# Patient Record
Sex: Female | Born: 1970 | Race: Black or African American | Hispanic: No | Marital: Single | Smoking: Never smoker
Health system: Southern US, Community
[De-identification: ages and names within clinical notes are randomized; demographics above are authoritative.]

## PROBLEM LIST (undated history)

## (undated) ENCOUNTER — Emergency Department (HOSPITAL_BASED_OUTPATIENT_CLINIC_OR_DEPARTMENT_OTHER): Admission: EM | Payer: Medicaid Other

## (undated) DIAGNOSIS — R42 Dizziness and giddiness: Secondary | ICD-10-CM

## (undated) HISTORY — PX: OTHER SURGICAL HISTORY: SHX169

---

## 1997-09-25 ENCOUNTER — Other Ambulatory Visit: Admission: RE | Admit: 1997-09-25 | Discharge: 1997-09-25 | Payer: Self-pay | Admitting: Obstetrics and Gynecology

## 1997-10-23 ENCOUNTER — Encounter: Admission: RE | Admit: 1997-10-23 | Discharge: 1997-10-23 | Payer: Self-pay | Admitting: Family Medicine

## 1998-02-23 ENCOUNTER — Encounter: Payer: Self-pay | Admitting: Obstetrics and Gynecology

## 1998-02-23 ENCOUNTER — Ambulatory Visit (HOSPITAL_COMMUNITY): Admission: RE | Admit: 1998-02-23 | Discharge: 1998-02-23 | Payer: Self-pay | Admitting: Obstetrics and Gynecology

## 1998-03-24 ENCOUNTER — Inpatient Hospital Stay (HOSPITAL_COMMUNITY): Admission: AD | Admit: 1998-03-24 | Discharge: 1998-03-24 | Payer: Self-pay | Admitting: Obstetrics and Gynecology

## 1998-06-12 ENCOUNTER — Inpatient Hospital Stay (HOSPITAL_COMMUNITY): Admission: AD | Admit: 1998-06-12 | Discharge: 1998-06-12 | Payer: Self-pay | Admitting: Obstetrics and Gynecology

## 1998-06-12 ENCOUNTER — Inpatient Hospital Stay (HOSPITAL_COMMUNITY): Admission: AD | Admit: 1998-06-12 | Discharge: 1998-06-16 | Payer: Self-pay | Admitting: Obstetrics and Gynecology

## 1998-07-03 ENCOUNTER — Encounter: Admission: RE | Admit: 1998-07-03 | Discharge: 1998-07-03 | Payer: Self-pay | Admitting: Family Medicine

## 1998-07-10 ENCOUNTER — Encounter: Admission: RE | Admit: 1998-07-10 | Discharge: 1998-07-10 | Payer: Self-pay | Admitting: Family Medicine

## 1998-07-13 ENCOUNTER — Encounter: Admission: RE | Admit: 1998-07-13 | Discharge: 1998-07-13 | Payer: Self-pay | Admitting: Family Medicine

## 1999-03-10 ENCOUNTER — Other Ambulatory Visit: Admission: RE | Admit: 1999-03-10 | Discharge: 1999-03-10 | Payer: Self-pay | Admitting: Obstetrics and Gynecology

## 1999-12-12 ENCOUNTER — Inpatient Hospital Stay (HOSPITAL_COMMUNITY): Admission: AD | Admit: 1999-12-12 | Discharge: 1999-12-13 | Payer: Self-pay | Admitting: Obstetrics and Gynecology

## 2000-02-14 ENCOUNTER — Other Ambulatory Visit: Admission: RE | Admit: 2000-02-14 | Discharge: 2000-02-14 | Payer: Self-pay | Admitting: Obstetrics and Gynecology

## 2001-03-06 ENCOUNTER — Other Ambulatory Visit: Admission: RE | Admit: 2001-03-06 | Discharge: 2001-03-06 | Payer: Self-pay | Admitting: Internal Medicine

## 2001-05-09 ENCOUNTER — Encounter: Admission: RE | Admit: 2001-05-09 | Discharge: 2001-05-09 | Payer: Self-pay | Admitting: Family Medicine

## 2001-05-09 ENCOUNTER — Encounter: Payer: Self-pay | Admitting: Family Medicine

## 2001-12-09 ENCOUNTER — Inpatient Hospital Stay (HOSPITAL_COMMUNITY): Admission: AD | Admit: 2001-12-09 | Discharge: 2001-12-09 | Payer: Self-pay | Admitting: *Deleted

## 2002-01-25 ENCOUNTER — Other Ambulatory Visit: Admission: RE | Admit: 2002-01-25 | Discharge: 2002-01-25 | Payer: Self-pay | Admitting: Obstetrics and Gynecology

## 2002-03-19 ENCOUNTER — Ambulatory Visit (HOSPITAL_COMMUNITY): Admission: RE | Admit: 2002-03-19 | Discharge: 2002-03-19 | Payer: Self-pay | Admitting: Obstetrics and Gynecology

## 2002-03-19 ENCOUNTER — Encounter: Payer: Self-pay | Admitting: Obstetrics and Gynecology

## 2002-05-28 ENCOUNTER — Inpatient Hospital Stay (HOSPITAL_COMMUNITY): Admission: AD | Admit: 2002-05-28 | Discharge: 2002-05-28 | Payer: Self-pay | Admitting: Internal Medicine

## 2002-06-11 ENCOUNTER — Encounter: Admission: RE | Admit: 2002-06-11 | Discharge: 2002-06-11 | Payer: Self-pay | Admitting: Obstetrics and Gynecology

## 2002-06-14 ENCOUNTER — Inpatient Hospital Stay (HOSPITAL_COMMUNITY): Admission: AD | Admit: 2002-06-14 | Discharge: 2002-06-14 | Payer: Self-pay | Admitting: Obstetrics and Gynecology

## 2002-06-28 ENCOUNTER — Encounter: Payer: Self-pay | Admitting: Obstetrics and Gynecology

## 2002-06-28 ENCOUNTER — Inpatient Hospital Stay (HOSPITAL_COMMUNITY): Admission: AD | Admit: 2002-06-28 | Discharge: 2002-07-02 | Payer: Self-pay | Admitting: Obstetrics and Gynecology

## 2002-06-28 ENCOUNTER — Encounter (INDEPENDENT_AMBULATORY_CARE_PROVIDER_SITE_OTHER): Payer: Self-pay | Admitting: Specialist

## 2002-07-02 ENCOUNTER — Inpatient Hospital Stay (HOSPITAL_COMMUNITY): Admission: AD | Admit: 2002-07-02 | Discharge: 2002-07-02 | Payer: Self-pay | Admitting: Obstetrics and Gynecology

## 2003-11-20 ENCOUNTER — Other Ambulatory Visit: Admission: RE | Admit: 2003-11-20 | Discharge: 2003-11-20 | Payer: Self-pay | Admitting: Obstetrics and Gynecology

## 2004-01-07 ENCOUNTER — Emergency Department (HOSPITAL_COMMUNITY): Admission: EM | Admit: 2004-01-07 | Discharge: 2004-01-07 | Payer: Self-pay

## 2004-07-20 ENCOUNTER — Emergency Department (HOSPITAL_COMMUNITY): Admission: EM | Admit: 2004-07-20 | Discharge: 2004-07-20 | Payer: Self-pay | Admitting: Emergency Medicine

## 2004-07-27 ENCOUNTER — Ambulatory Visit (HOSPITAL_COMMUNITY): Admission: RE | Admit: 2004-07-27 | Discharge: 2004-07-27 | Payer: Self-pay | Admitting: Obstetrics and Gynecology

## 2004-09-22 ENCOUNTER — Inpatient Hospital Stay (HOSPITAL_COMMUNITY): Admission: AD | Admit: 2004-09-22 | Discharge: 2004-09-23 | Payer: Self-pay | Admitting: Obstetrics and Gynecology

## 2004-09-23 ENCOUNTER — Inpatient Hospital Stay (HOSPITAL_COMMUNITY): Admission: AD | Admit: 2004-09-23 | Discharge: 2004-09-23 | Payer: Self-pay | Admitting: Obstetrics and Gynecology

## 2004-10-25 ENCOUNTER — Inpatient Hospital Stay (HOSPITAL_COMMUNITY): Admission: AD | Admit: 2004-10-25 | Discharge: 2004-10-25 | Payer: Self-pay | Admitting: Obstetrics and Gynecology

## 2004-11-02 ENCOUNTER — Encounter: Admission: RE | Admit: 2004-11-02 | Discharge: 2004-11-02 | Payer: Self-pay | Admitting: Obstetrics and Gynecology

## 2004-11-08 ENCOUNTER — Inpatient Hospital Stay (HOSPITAL_COMMUNITY): Admission: AD | Admit: 2004-11-08 | Discharge: 2004-11-08 | Payer: Self-pay | Admitting: Obstetrics and Gynecology

## 2004-11-22 ENCOUNTER — Inpatient Hospital Stay (HOSPITAL_COMMUNITY): Admission: AD | Admit: 2004-11-22 | Discharge: 2004-11-22 | Payer: Self-pay | Admitting: Obstetrics and Gynecology

## 2004-12-06 ENCOUNTER — Inpatient Hospital Stay (HOSPITAL_COMMUNITY): Admission: AD | Admit: 2004-12-06 | Discharge: 2004-12-06 | Payer: Self-pay | Admitting: Obstetrics and Gynecology

## 2004-12-07 ENCOUNTER — Inpatient Hospital Stay (HOSPITAL_COMMUNITY): Admission: AD | Admit: 2004-12-07 | Discharge: 2004-12-08 | Payer: Self-pay | Admitting: Obstetrics and Gynecology

## 2004-12-13 ENCOUNTER — Inpatient Hospital Stay (HOSPITAL_COMMUNITY): Admission: AD | Admit: 2004-12-13 | Discharge: 2004-12-13 | Payer: Self-pay | Admitting: Obstetrics and Gynecology

## 2004-12-18 ENCOUNTER — Inpatient Hospital Stay (HOSPITAL_COMMUNITY): Admission: AD | Admit: 2004-12-18 | Discharge: 2004-12-18 | Payer: Self-pay | Admitting: Obstetrics and Gynecology

## 2004-12-31 ENCOUNTER — Inpatient Hospital Stay (HOSPITAL_COMMUNITY): Admission: RE | Admit: 2004-12-31 | Discharge: 2005-01-03 | Payer: Self-pay | Admitting: Obstetrics and Gynecology

## 2004-12-31 ENCOUNTER — Encounter (INDEPENDENT_AMBULATORY_CARE_PROVIDER_SITE_OTHER): Payer: Self-pay | Admitting: Specialist

## 2005-10-04 ENCOUNTER — Emergency Department (HOSPITAL_COMMUNITY): Admission: EM | Admit: 2005-10-04 | Discharge: 2005-10-04 | Payer: Self-pay | Admitting: Emergency Medicine

## 2006-04-26 ENCOUNTER — Emergency Department (HOSPITAL_COMMUNITY): Admission: EM | Admit: 2006-04-26 | Discharge: 2006-04-27 | Payer: Self-pay | Admitting: Emergency Medicine

## 2006-08-26 ENCOUNTER — Emergency Department (HOSPITAL_COMMUNITY): Admission: EM | Admit: 2006-08-26 | Discharge: 2006-08-26 | Payer: Self-pay | Admitting: Emergency Medicine

## 2008-05-26 ENCOUNTER — Emergency Department (HOSPITAL_COMMUNITY): Admission: EM | Admit: 2008-05-26 | Discharge: 2008-05-26 | Payer: Self-pay | Admitting: Emergency Medicine

## 2008-12-06 ENCOUNTER — Emergency Department (HOSPITAL_COMMUNITY): Admission: EM | Admit: 2008-12-06 | Discharge: 2008-12-06 | Payer: Self-pay | Admitting: Emergency Medicine

## 2009-12-05 ENCOUNTER — Emergency Department (HOSPITAL_COMMUNITY): Admission: EM | Admit: 2009-12-05 | Discharge: 2009-12-05 | Payer: Self-pay | Admitting: Family Medicine

## 2010-01-11 ENCOUNTER — Emergency Department (HOSPITAL_COMMUNITY)
Admission: EM | Admit: 2010-01-11 | Discharge: 2010-01-11 | Payer: Self-pay | Source: Home / Self Care | Admitting: Emergency Medicine

## 2010-03-22 LAB — URINALYSIS, ROUTINE W REFLEX MICROSCOPIC
Bilirubin Urine: NEGATIVE
Glucose, UA: NEGATIVE mg/dL
Ketones, ur: NEGATIVE mg/dL
Protein, ur: NEGATIVE mg/dL
pH: 8 (ref 5.0–8.0)

## 2010-03-22 LAB — POCT I-STAT, CHEM 8
BUN: 9 mg/dL (ref 6–23)
Chloride: 104 mEq/L (ref 96–112)
Creatinine, Ser: 0.9 mg/dL (ref 0.4–1.2)
Potassium: 4 mEq/L (ref 3.5–5.1)
Sodium: 139 mEq/L (ref 135–145)
TCO2: 27 mmol/L (ref 0–100)

## 2010-03-22 LAB — URINE MICROSCOPIC-ADD ON

## 2010-03-22 LAB — POCT PREGNANCY, URINE: Preg Test, Ur: NEGATIVE

## 2010-04-14 LAB — DIFFERENTIAL
Eosinophils Absolute: 0 10*3/uL (ref 0.0–0.7)
Lymphs Abs: 1.6 10*3/uL (ref 0.7–4.0)
Monocytes Absolute: 0.5 10*3/uL (ref 0.1–1.0)
Monocytes Relative: 12 % (ref 3–12)
Neutro Abs: 2.2 10*3/uL (ref 1.7–7.7)
Neutrophils Relative %: 50 % (ref 43–77)

## 2010-04-14 LAB — BASIC METABOLIC PANEL
CO2: 29 mEq/L (ref 19–32)
Chloride: 102 mEq/L (ref 96–112)
Creatinine, Ser: 1 mg/dL (ref 0.4–1.2)
GFR calc Af Amer: >60 mL/min (ref >60)
Sodium: 137 mEq/L (ref 135–145)

## 2010-04-14 LAB — CBC
Hemoglobin: 13.3 g/dL (ref 12.0–15.0)
MCHC: 33.9 g/dL (ref 30.0–36.0)
MCV: 99 fL (ref 78.0–100.0)
RBC: 3.94 MIL/uL (ref 3.87–5.11)
WBC: 4.3 10*3/uL (ref 4.0–10.5)

## 2010-04-20 LAB — POCT CARDIAC MARKERS
CKMB, poc: <1 ng/mL — ABNORMAL LOW (ref 1.0–8.0)
Myoglobin, poc: 88.5 ng/mL (ref 12–200)
Myoglobin, poc: 99.7 ng/mL (ref 12–200)

## 2010-04-20 LAB — D-DIMER, QUANTITATIVE: D-Dimer, Quant: 0.42 ug/mL-FEU (ref 0.00–0.48)

## 2010-05-28 NOTE — Discharge Summary (Signed)
[name], [name]               ACCOUNT NO.:  [id number]   MEDICAL RECORD NO.:  [id number]          PATIENT TYPE:  INP   LOCATION:  9112                          FACILITY:  [facility]   PHYSICIAN:  Malachi Pro. Ambrose Mantle, M.D. DATE OF BIRTH:  [birth year]   DATE OF ADMISSION:  12/31/2004  DATE OF DISCHARGE:  01/03/2005                                 DISCHARGE SUMMARY   This is a 40 year old black female, para 2-2-7-4, gravida 12, admitted for a  repeat C-section.  Blood group and type A positive, negative antibody, non-  reactive serology, rubella positive, RPR non-reactive, hemoglobin A, HIV  negative, GC and chlamydia negative, hepatitis B surface antigen negative.  The patient's prenatal course is outlined in the history and physical.  She  was admitted for a repeat C-section and tubal ligation after two prior C-  sections.  She underwent a low transverse cervical C-section and bilateral  tubal ligation by Dr. Ambrose Mantle with Dr. Senaida Ores assisting under general  anesthesia on December 31, 2004.  The female infant, 7 pounds 3 ounces, had  Apgars of 9 at one and 9 at five minutes.  Postoperatively, the patient did  very well.  She had no problems.  On the third postop day, she was voiding  well, passing flatus, tolerating a diet, ambulating well without difficulty,  and was ready for discharge.  Staples removed, strips were applied.   FINAL DIAGNOSES:  1.  Intrauterine pregnancy 39 weeks.  2.  Gestational diabetes.  3.  Voluntary sterilization.  4.  Prior Cesarean section x2.   OPERATION:  Low transverse cervical C-section, bilateral tubal ligation.   FINAL CONDITION:  Improved.   INSTRUCTIONS:  Include our regular discharge instruction booklet.  Patient  is advised to call with any problems.  Percocet 5/325 24 tablets one every  four to six hours as needed for pain is given at discharge.      Malachi Pro. Ambrose Mantle, M.D.  Electronically Signed     TFH/MEDQ  D:  01/03/2005  T:   01/03/2005  Job:  [id number]

## 2010-05-28 NOTE — Discharge Summary (Signed)
NAMEILITHYIA, TITZER                           ACCOUNT NO.:  [id number]   MEDICAL RECORD NO.:  [id number]                   PATIENT TYPE:  INP   LOCATION:  9128                                 FACILITY:  [facility]   PHYSICIAN:  Zenaida Niece, M.D.             DATE OF BIRTH:  Nov 18, 1970   DATE OF ADMISSION:  06/28/2002  DATE OF DISCHARGE:  07/02/2002                                 DISCHARGE SUMMARY   ADMISSION DIAGNOSES:  1. Intrauterine pregnancy at 32 weeks.  2. Preterm labor.  3. Abdominal pain.   DISCHARGE DIAGNOSES:  1. Intrauterine pregnancy at 32 weeks.  2. Placental abruption.  3. Fetal bradycardia.   PROCEDURES:  On June 18th she had a STAT repeat low transverse cesarean  section.   HISTORY AND PHYSICAL:  This is a 40 year old black female gravida 62, para 2-  1-7-3 with an _____of 32 weeks who presented with complaint of abdominal  pain that woke her up at approximately 0200 on the day of admission without  bleeding or rupture of membranes and with decreased fetal movement.  On  presentation to Maternity Admission, she was found to have frequent  contractions and a shot of terbutaline gave her minimal relief.  Vaginal  exam was initially 2 cm, 90%, and minus 1.  Prenatal care complicated by  gastroesophageal reflux treated with Zantac, trichomonas on April 30th  treated with p.o. Flagyl, class A1 gestational diabetes with fair control  for the patient and the patient was seen in triage on June 4th for abdominal  trauma.   PRENATAL LABORATORY DATA:  Blood type A positive with negative antibody  screen, sickle screen is negative, RPR nonreactive, rubella immune,  hepatitis B surface antigen negative, HIV negative.  Gonorrhea and Chlamydia  negative.  Triple screen normal.  One-hour Glucola 182, three-hour GTT 86,  193, 186, and 72.   PAST OBSTETRICAL HISTORY:  In 1989 vaginal delivery at 37 weeks, 6 pounds  complicated by preterm labor, PIH, and abnormal platelet  counts.  In 1992  low transverse cesarean section at 38 weeks, 7 pounds 5 ounces complicated  by preterm labor and fetal distress.  In 2000 she had a VBAC at 34 weeks, 4  pounds 2 ounces complicated by PIH.  She has had 5 elective terminations and  two spontaneous abortions.   GYNECOLOGICAL HISTORY:  History of Chlamydia and history of cryotherapy and  a cone biopsy in 1991 with normal followup.   PAST SURGICAL HISTORY:  Low transverse cesarean section and a laser cone.   ALLERGIES:  She does get a rash with metronidazole.   MEDICATIONS:  Zantac 150 mg b.i.d.   PHYSICAL EXAMINATION:  VITAL SIGNS:  Initially she was afebrile with stable  vital signs.  FETAL MONITORING:  Fetal heart tracing had an initial baseline in the 160s  which then decreased to the 150s with minimal variability, initially with  one 2-minute  mild deceleration and no accelerations.  She was having  contractions every 3-5 minutes.  ABDOMEN:  Gravid, slightly tender diffusely with a fundal height of 33 cm  recently and a transverse scar.  EXTREMITIES:  Had 1+ edema, nontender.  DTRs were 2 out 4.  PELVIC:  Vaginal exam was 2 cm, 90%, minus 2 with a questionable vertex  presentation.   ADMISSION LABORATORY DATA:  Admission labs revealed a urinalysis with 3-6  white blood cells, 0-2 red blood cells, few epithelial, many bacteria.  Initial CBC had a white count of 7.3, hemoglobin 13, platelet count 176.   HOSPITAL COURSE:  Once the terbutaline failed to control her contractions,  she was started on magnesium sulfate.  She was also given a shot of  betamethasone to aid in pulmonary maturation.  I saw the patient in triage  and although she had received her bolus of magnesium she was still having  significant abdominal pain.  An ultrasound was ordered to evaluate the  placenta for possible abruption as well as to evaluate the baby with a  nonreactive nonstress test.  The ultrasound tech came to the room and   immediately called the radiologist for assistance.  The did visualize an  approximately 6 cm clot in the posterior portion of the uterus.  Heart tones  at that point were 150s.  The decision was made to proceed with cesarean  section.  Immediately after that the fetal heart tones dropped to the 70s  and did not recover.  She was taken for a STAT cesarean section.  She had a  STAT repeat low transverse cesarean section under general anesthesia.  Estimated blood loss was 1000 mL, urine output was 50 mL.  She had a normal-  appearing uterus.  She delivered a viable female infant with Apgars of 2, 6,  and 7 that weighed 4 pounds 2 ounces and had an arterial cord pH of 6.97.  There was 200-300 mL of clot in the uterus.  Tubes and ovaries were normal.  Postpartum she did really well.  Predelivery hemoglobin 13, postdelivery  9.9.  She did have an episode of lightheadedness in the bathroom and fell  without significant injury.  She developed low-grade temperatures too 100.5  and Dr. Ambrose Mantle put her on oral Augmentin.  She was also started on Anusol  cream for hemorrhoids.  Her incision was healing well.  Baby was doing well  in the NICU.  On the morning of postop day #4, the patient was afebrile,  incision was healing well and she was subsequently stable enough for a  discharge home.   DISCHARGE INSTRUCTIONS:  Regular diet, pelvic rest, and no strenuous  activity.  Followup is in approximately 10 days for an incision check.  Prior to discharge the nurses to remove staples and apply Steri-Strips.  Medications are Percocet, dispense #30, one to two p.o. q.4-6 h. p.r.n.  pain.  She is given our discharge pamphlet.                                               Zenaida Niece, M.D.    TDM/MEDQ  D:  07/02/2002  T:  07/02/2002  Job:  [id number]

## 2010-05-28 NOTE — Op Note (Signed)
NAMEJONAH, Robin [name]                           ACCOUNT NO.:  [id number]   MEDICAL RECORD NO.:  [id number]                   PATIENT TYPE:  INP   LOCATION:  9199                                 FACILITY:  [facility]   PHYSICIAN:  Zenaida Niece, M.D.             DATE OF BIRTH:  08-25-1970   DATE OF PROCEDURE:  06/28/2002  DATE OF DISCHARGE:                                 OPERATIVE REPORT   PREOPERATIVE DIAGNOSES:  1. Intrauterine pregnancy at 32 weeks.  2. Abruptio placentae.  3. Fetal bradycardia.   POSTOPERATIVE DIAGNOSES:  1. Intrauterine pregnancy at 32 weeks.  2. Abruptio placentae.  3. Fetal bradycardia.   PROCEDURE:  Repeat low transverse cesarean section.   SURGEON:  Zenaida Niece, M.D.   ASSISTANT:  Huel Cote, M.D.   ANESTHESIA:  General endotracheal tube.   ESTIMATED BLOOD LOSS:  1000 mL.   URINE OUTPUT:  50 mL.   FINDINGS:  Normal uterus.  Delivery of a viable female infant with Apgars of  2, 6, and 7, that weighed 4 pounds 2 ounces and had an arterial cord pH of  6.97.  There was 200-300 mL of clot in the uterus.  Tubes and ovaries  appeared normal.  In triage the patient had an ultrasound to evaluate for  possible abruption.  They were able to visualize a 6 cm clot.  The fetal  heart tracing initially was in the 150s but immediately after the  ultrasound, she had a prolonged deceleration to the 70s and was taken for a  stat C-section.   PROCEDURE IN DETAIL:  The patient was taken to the operating room and placed  in the dorsal supine position.  A rapid prep was done and a Foley catheter  inserted, and she was draped in a sterile fashion.  General anesthesia was  induced.  Her abdomen was then rapidly entered via her previous Pfannenstiel  incision.  The vesicouterine peritoneum was incised and a bladder flap  created digitally.  A 4 cm transverse incision was made in the lower uterine  segment and extended bilaterally digitally.  Clear fluid was  noted.  The  baby was then delivered atraumatically.  The cord was doubly clamped and cut  and the infant handed to the awaiting pediatric team.  Cord blood and cord  gas were obtained and the placenta delivered spontaneously along with 200-  300 mL of clot in the uterus.  The uterus rapidly firmed up.  The uterus was  exteriorized.  The incision was inspected and found to be free of  extensions.  The uterine incision was closed in one layer, being a running  locking layer with #1 chromic, with adequate hemostasis.  Bleeding from the  serosal edges was controlled with electrocautery.  Bleeding from the left  angle was controlled with #1 chromic.  Both tubes and ovaries were normal.  The uterus was returned to the  abdomen and all gutters blotted to remove  clots and debris.  The bladder flap was inspected and bleeding controlled  with electrocautery.  The uterine incision was again inspected and found to  be hemostatic.  The subfascial space was irrigated and made hemostatic with  electrocautery.  Widely spaced rectus muscles were reapproximated with  interrupted sutures of #1 chromic.  The fascia was closed with running 0  Vicryl starting at both ends and meeting in the middle.  The subcutaneous  tissue was irrigated and made hemostatic with electrocautery.  The skin was  closed with staples and a sterile dressing.  The patient tolerated the  procedure well, was extubated in the operating room, and taken to the  recovery room in stable condition.  Counts were correct.  She was given  Ancef 1 g at cord clamp.                                                Zenaida Niece, M.D.    TDM/MEDQ  D:  06/28/2002  T:  06/28/2002  Job:  [id number]

## 2010-05-28 NOTE — H&P (Signed)
Robin [name], Robin [name]               ACCOUNT NO.:  [id number]   MEDICAL RECORD NO.:  [id number]            PATIENT TYPE:   LOCATION:                                 FACILITY:   PHYSICIAN:  Malachi Pro. Ambrose Mantle, M.D.      DATE OF BIRTH:   DATE OF ADMISSION:  12/31/2004  DATE OF DISCHARGE:                                HISTORY & PHYSICAL   HISTORY OF PRESENT ILLNESS:  This is a 40 year old black female, para 2-2-7-  4, gravida 25, was admitted for repeat cesarean section having had two prior  cesarean sections.  Last menstrual period unknown.  EDC by ultrasound  January 10, 2005.  Blood group and type O positive, negative antibody.  Sickle cell negative.  RPR nonreactive.  Rubella immune.  Hepatitis B  surface antigen negative.  HIV negative.  GC and Chlamydia negative.  One-  hour Glucola 184 at 28 weeks, 157 at about 18 weeks and three-hour glucose  tolerance test 91, 224, 178, and 120.  The patient had a vaginal ultrasound  on June 11, 2004. Crown-rump length was 2.69 cm, 9 weeks 4 days, [facility] January 10, 2005.  Because of her advanced maternal age, she was offered and she  underwent first trimester screening which showed a decreased risk of Down  syndrome and trisomy 5 and 58.  Her age-related risk of Down syndrome was 1  in 293.  After the PAPP-A test and nuchal translucency, her risk was 1 in  5841, and for the trisomy 18 and 13, her risk was 1 in 95 by age but after  the testing, 1 in 6631.  The patient had a complicated pregnancy requiring  innumerable visits to the office but nothing of major significance.  At 15  weeks she was stricken by a severe case of vertigo, treated with meclizine.  A CT scan of the head was negative.  A repeat ultrasound at 18 weeks showed  an [facility] of January 09, 2005.  A limited view of the heart was obtained but  on a followup ultrasound at 22 weeks, heart exam was normal.  A slightly  shortened cervix was seen and the patient was advised restricted  activity.  The patient continued to have problems with vertigo and also heartburn  treated with Zantac.  The patient complained on multiple occasions of  uterine contractions.  Fetal fibronectin test on October 9 was negative.  She was seen in the diabetic treatment and management clinic on November 02, 2004 and repeatedly failed to bring her log books to the office but reported  that her blood sugars were within control.  Another fetal fibronectin test  was done on October 26 and it was negative.  Because of complaints of  decreased fetal movement, non-stress tests were done and they were always  reactive.  Beginning on November 28, non-stress tests were advised two times  a week.  She was treated with Pen-Vee-K on December 17, 2004 for an abscessed  tooth.  Cervix has remained fingertip and 80% for the last several weeks.  On the day  prior to admission, she complained of decreased fetal movement.  Non-stress test was reactive.   ALLERGIES:  1.  Possible allergy to METRONIDAZOLE caused a rash.  2.  She also has a possible LATEX allergy.   PAST MEDICAL HISTORY:  1.  She has had history of Chlamydia.  2.  Pregnancy-induced high blood pressure.  3.  She does have gestational diabetes.  4.  She has had cesarean section in 1992 and 2004.  5.  Conization of the cervix in 1991.   OB HISTORY:  1.  1988, she had a early abortion.  2.  1989, a 6 pound female vaginally.  3.  1990, an early abortion.  4.  1991, an early abortion.  5.  1992, a 7 pound 5 ounce female by cesarean section for preterm labor and      fetal distress.  6.  1996, a missed abortion.  7.  1998, spontaneous abortion.  8.  June 2000, a 4 pound 2 ounce infant delivered VBAC because of PIH and      intrauterine growth restriction.  9.  2001, an early abortion.  10. Later in 2001, another early abortion.  11. In June 2004, a 4 pound 2 ounce female at 32 weeks under general      anesthesia for abruption and fetal stress.  12.  The patient's current pregnancy is her 12th pregnancy.   She did have cryo surgery on her cervix in 1991 as well as a conization in  1991.   FAMILY HISTORY:  A son had febrile seizures.  There is no close family  history of significance.   PHYSICAL EXAMINATION:  GENERAL:  Well-developed, black female, weighing 185  pounds, in no major distress.  VITAL SIGNS: Blood pressure 130/70, pulse 80.  HEENT:  No significant abnormalities.  HEART:  Normal size and sounds.  No murmurs.  LUNGS:  Clear to auscultation.  ABDOMEN:  Soft. Fundal height 38.5 cm.  Fetal heart tones normal.  Reactive  non-stress test.  PELVIC:  Cervix fingertip, 80%, vertex at -2.   The patient has signed Medicaid sterilization forms and wants her tubes  tied.   ADMITTING IMPRESSION:  1.  Intrauterine pregnancy at 39 weeks.  2.  Two prior cesarean sections.  3.  Gestational diabetes mellitus.  4.  Voluntary sterilization.   The patient is admitted for cesarean section and tubal ligation.  She  understands the risks and is ready to proceed.      Malachi Pro. Ambrose Mantle, M.D.  Electronically Signed     TFH/MEDQ  D:  12/30/2004  T:  12/31/2004  Job:  [id number]

## 2010-05-28 NOTE — Discharge Summary (Signed)
[facility]  Patient:    Robin [name], Robin [name]                        MRN: [id number] Adm. Date:  [id number] Disc. Date: 12/13/99 Attending:  Michaele Offer                           Discharge Summary  ADMISSION DIAGNOSIS:  Abdominal and pelvic pain with possible salpingitis.  DISCHARGE DIAGNOSES: 1. Abdominal and pelvic pain with possible salpingitis. 2. Bacterial vaginosis.  PROCEDURES:  Intravenous antibiotics.  COMPLICATIONS:  None.  CONSULTATIONS:  None.  HISTORY AND PHYSICAL:  This is a 40 year old black female, gravida 8, para 3-1-5-3, with an LMP of November 23, 1999, who was in her usual state of good health until the day of admission.  She had the sudden onset of diffuse lower abdominal and pelvic pain at approximately 1100 on the day of admission, which was a constant ache with waves of increasing pain to a 10 out of 10.  She has felt cold, but no subjective fever.  She has had nausea without emesis and mild constipation with a small bowel movement on the day of admission after taking liquid Milk of Magnesia.  She does have anorexia.  She has no urinary symptoms.  She took Motrin 800 mg for this pain without relief and then came to maternity admissions.  Her last coitus was the day prior to admission and she had mild discomfort.  PAST OB HISTORY:  Significant for two vaginal deliveries, one preterm, one cesarean section, two spontaneous abortions, and three elective abortions.  PAST MEDICAL HISTORY:  Negative.  PAST GYN HISTORY:  Rule out history of chlamydia and cryotherapy in 1995.  SURGICAL HISTORY:  Significant only for the cesarean section.  SOCIAL HISTORY:  She has been monogamous for five years and denies having any other sexual partners.  She says she uses condoms for contraception.  PHYSICAL EXAMINATION:  VITAL SIGNS:  The temperature on maternity admissions was 101.3, pulse 103, respirations 20, blood pressure  134/83.  GENERAL:  She is a well-developed, well-nourished black female who is in mild distress.  BACK:  No CVA tenderness with mild low back tenderness.  ABDOMEN:  Soft, tender without palpable masses.  No rebound and plus/minus guarding.  PELVIC:  Revealed normal external genitalia.  On speculum exam, she had a slight whitish discharge and cultures were done.  On bimanual exam, she had a small mild, planar, tender uterus with mild cervical motion tenderness and bilateral adnexal tenderness without palpable masses.  ADMISSION LABORATORIES:  White count 13.9 with 86% neutrophils, hemoglobin 13.5, platelet count 211,000.  Urinalysis is negative.  Comprehensive metabolic panel was negative.  Urine pregnancy test was negative.  HOSPITAL COURSE:  In maternity admissions, the patient was offered to go home on oral antibiotics.  She was given a dose of Cipro p.o. and had emesis with this.  Thus, she was admitted for IV antibiotic therapy for possible PID.  She was started on IV Cefotan and doxycycline.  She remained afebrile throughout the remainder of her hospital course.  On the morning of December 2, the abdominal tenderness improved and she had no nausea or vomiting.  White count had dropped to 9.9 and a SED rate returned 18.  Wet prep did have clue cells. The patient did not feel like she was able to go home at this time.  Later that  evening, her IV infiltrated and she was given the option of going home on oral antibiotics or having the IV restarted.  She opted to have the IV restarted and was continued on her Cefotan and doxycycline.  On the morning of December 3, she continued to be afebrile.  Her abdomen had significant decreased tenderness and the patient requested discharge home.  Laboratories pending at the time of discharge are gonorrhea and chlamydia cultures.  CONDITION ON DISCHARGE:  Stable.  DISPOSITION:  Discharged to home.  DISCHARGE INSTRUCTIONS:  Diet as regular.   Activities as tolerated.  Follow-up is in approximately 10 days.  DISCHARGE MEDICATIONS:  1. Floxin 400 mg p.o. b.i.d.  2. Flagyl 500 mg p.o. b.i.d.  3. Anaprox b.i.d. for pain.  The patient is to call if her symptoms worsen while she is on the antibiotics. D:  12/13/99 TD:  12/13/99 Job: [id number]

## 2010-05-28 NOTE — Op Note (Signed)
Robin [name], Robin [name]               ACCOUNT NO.:  [id number]   MEDICAL RECORD NO.:  [id number]          PATIENT TYPE:  INP   LOCATION:  9141                          FACILITY:  [facility]   PHYSICIAN:  Malachi Pro. Ambrose Mantle, M.D. DATE OF BIRTH:  05-Dec-1970   DATE OF PROCEDURE:  12/31/2004  DATE OF DISCHARGE:                                 OPERATIVE REPORT   PREOPERATIVE DIAGNOSES:  1.  Intrauterine pregnancy at 39 weeks.  2.  Gestational diabetes.  3.  Voluntary sterilization.  4.  Prior C-section x2.   POSTOPERATIVE DIAGNOSES:  1.  Intrauterine pregnancy at 39 weeks.  2.  Gestational diabetes.  3.  Voluntary sterilization.  4.  Prior C-section x2.   OPERATION:  Low transverse cervical C-section, bilateral tubal ligation.   OPERATOR:  Dr. Ambrose Mantle   ASSISTANT:  Dr. Senaida Ores   ANESTHESIA:  General.   The patient declined regional anesthesia so underwent general anesthesia.  She was brought to the operating room.  Fetal heart rate was confirmed as  normal.  The abdomen, urethra were prepped with Betadine solution, and a  Foley catheter was inserted to drain, and the abdomen was draped as a  sterile field.  The patient had had 2 previous C-sections, and the scars  were not completely overlying each other.  After general anesthesia was  induced and tracheal intubation was done, the anesthesiologist gave me the  go-ahead.  I made an incision close to where the others were, carried in  layers through the skin, subcutaneous tissue, and fascia.  The fascia was  incised transversely, separated from the rectus muscles superiorly and  inferiorly.  There was significant scarring of the muscle to the fascia.  I  then entered the abdominal cavity in the midline, enlarged the incision,  exposed the lower uterine segment, and I made an incision into the lower  uterine segment with the knife part way through the myometrium and then the  additional entry with my finger, enlarged the incision just by  blunt  dissection laterally, delivered the baby through the incisional opening.  There was 1 loop of nuchal cord.  The infant's nose and pharynx were  suctioned with a bulb.  The remainder of the baby was delivered.  Cord was  clamped.  The infant was given to the neonatologist who was in attendance.  The baby was 7 pounds 3 ounces, female, Apgars of 9 and 9 at one and five  minutes.  A segment of cord was preserved in case a pH was necessary.  Routine cord blood studies were obtained.  Placenta was removed intact.  The  inside of the uterus felt normal.  I did not dilate the cervix because the  cervix was already a fingertip dilated in the office.  The uterine incision  was closed with 1 layer using a running locked suture of 0 Vicryl, but there  was still several sites that were bleeding and required several figure-of-  eight sutures to control the bleeding.  Both tubes were identified, traced  to their fimbriated ends.  Both tubes and ovaries appeared normal.  The  midportion of the mesosalpinx on either tube was touched with the Bovie to  create a window, and then 2 ties of 0 plain catgut were placed on the tubes  proximally and distally, and the segment of tube in between was excised.  There was no bleeding.  Attention again was placed to the uterus, and a  couple of other sutures were required for complete hemostasis.  Liberal  irrigation confirmed hemostasis.  So then the abdominal wall was closed with  interrupted sutures of 0 Vicryl on the fascia including the peritoneum then  2 running sutures of 0 Vicryl on the fascia, running 3-0 Vicryl and the  subcu tissue, and staples on the skin.  The patient seemed to tolerate the  procedure well.  Blood loss was estimated at 1000 mL.  Sponge and needle  counts were correct, and she was returned to recovery in satisfactory  condition.      Malachi Pro. Ambrose Mantle, M.D.  Electronically Signed     TFH/MEDQ  D:  12/31/2004  T:  01/01/2005   Job:  [id number]

## 2010-12-05 ENCOUNTER — Encounter: Payer: Self-pay | Admitting: *Deleted

## 2010-12-05 ENCOUNTER — Emergency Department (HOSPITAL_COMMUNITY): Payer: Self-pay

## 2010-12-05 ENCOUNTER — Emergency Department (HOSPITAL_COMMUNITY)
Admission: EM | Admit: 2010-12-05 | Discharge: 2010-12-05 | Disposition: A | Discharge status: Home / Self Care | Payer: Self-pay | Attending: Emergency Medicine | Admitting: Emergency Medicine

## 2010-12-05 DIAGNOSIS — R509 Fever, unspecified: Secondary | ICD-10-CM | POA: Insufficient documentation

## 2010-12-05 DIAGNOSIS — J111 Influenza due to unidentified influenza virus with other respiratory manifestations: Secondary | ICD-10-CM | POA: Insufficient documentation

## 2010-12-05 MED ORDER — OXYCODONE-ACETAMINOPHEN 5-325 MG PO TABS
1.0000 | ORAL_TABLET | Freq: Once | ORAL | Status: AC
  Administered 2010-12-05: 1 via ORAL
  Filled 2010-12-05: qty 1

## 2010-12-05 MED ORDER — IBUPROFEN 800 MG PO TABS
800.0000 mg | ORAL_TABLET | Freq: Once | ORAL | Status: AC
  Administered 2010-12-05: 800 mg via ORAL
  Filled 2010-12-05: qty 1

## 2010-12-05 MED ORDER — ACETAMINOPHEN 325 MG PO TABS
650.0000 mg | ORAL_TABLET | Freq: Once | ORAL | Status: AC
  Administered 2010-12-05: 650 mg via ORAL
  Filled 2010-12-05: qty 2

## 2010-12-05 NOTE — ED Notes (Signed)
Pt states that she was lying in bed last night when she noticed her body was achy and she felt feverish and began to cough.  Pt presents to [facility] tonight with c/o same.

## 2010-12-05 NOTE — ED Notes (Signed)
Pt verbalizes understanding dx instructions.

## 2010-12-09 NOTE — ED Provider Notes (Signed)
History    40yf with fever and cough. Onset less than day ago. Feels tired and generally achy. HA. Diffuse. Gradual onset. Denies trauma. No neck stiffness. No v/d. No rash. No dyspnea. No unusual swelling or leg pain.   CSN: [id number] Arrival date & time: 12/05/2010  8:42 PM   First MD Initiated Contact with Patient 12/05/10 2130      Chief Complaint  Patient presents with  . Influenza    cough, headache, chills, body aches    (Consider location/radiation/quality/duration/timing/severity/associated sxs/prior treatment) HPI  History reviewed. No pertinent past medical history.  Past Surgical History  Procedure Date  . Cesarian     x2    History reviewed. No pertinent family history.  History  Substance Use Topics  . Smoking status: Not on file  . Smokeless tobacco: Not on file  . Alcohol Use:     OB History    Grav Para Term Preterm Abortions TAB SAB Ect Mult Living                  Review of Systems   Review of symptoms negative unless otherwise noted in HPI.   Allergies  Review of patient's allergies indicates no known allergies.  Home Medications  No current outpatient prescriptions on file.  BP 149/75  Pulse 102  Temp(Src) 101.2 F (38.4 C) (Oral)  Resp 18  SpO2 100%  Physical Exam  Nursing note and vitals reviewed. Constitutional: She appears well-developed and well-nourished. No distress.  HENT:  Head: Normocephalic and atraumatic.  Right Ear: External ear normal.  Left Ear: External ear normal.  Mouth/Throat: Oropharynx is clear and moist. No oropharyngeal exudate.  Eyes: Conjunctivae are normal. Right eye exhibits no discharge. Left eye exhibits no discharge.  Neck: Normal range of motion. Neck supple.  Cardiovascular: Regular rhythm and normal heart sounds.  Exam reveals no gallop and no friction rub.   No murmur heard.      mildly tachycardic  Pulmonary/Chest: Effort normal and breath sounds normal. No respiratory distress. She has  no rales.  Abdominal: Soft. She exhibits no distension. There is no tenderness.  Musculoskeletal: She exhibits no edema and no tenderness.  Lymphadenopathy:    She has no cervical adenopathy.  Neurological: She is alert.  Skin: Skin is warm and dry. No rash noted. No erythema.  Psychiatric: She has a normal mood and affect. Her behavior is normal. Thought content normal.    ED Course  Procedures (including critical care time)  Labs Reviewed - No data to display No results found.   1. Influenza   2. Fever       MDM  40yF with febrile illness. Suspect viral, likely flu. Pt nontoxic. Cough but no respiratory distress. CXR without focal infiltrate. Plan symptomatic tx.        Raeford Razor, MD 12/09/10 [phone]

## 2011-03-28 ENCOUNTER — Encounter (HOSPITAL_COMMUNITY): Payer: Self-pay | Admitting: Emergency Medicine

## 2011-03-28 ENCOUNTER — Emergency Department (HOSPITAL_COMMUNITY)
Admission: EM | Admit: 2011-03-28 | Discharge: 2011-03-29 | Disposition: A | Discharge status: Home / Self Care | Payer: No Typology Code available for payment source | Attending: Emergency Medicine | Admitting: Emergency Medicine

## 2011-03-28 DIAGNOSIS — S139XXA Sprain of joints and ligaments of unspecified parts of neck, initial encounter: Secondary | ICD-10-CM | POA: Insufficient documentation

## 2011-03-28 DIAGNOSIS — S161XXA Strain of muscle, fascia and tendon at neck level, initial encounter: Secondary | ICD-10-CM

## 2011-03-28 DIAGNOSIS — M545 Low back pain, unspecified: Secondary | ICD-10-CM | POA: Insufficient documentation

## 2011-03-28 DIAGNOSIS — S39012A Strain of muscle, fascia and tendon of lower back, initial encounter: Secondary | ICD-10-CM

## 2011-03-28 DIAGNOSIS — M542 Cervicalgia: Secondary | ICD-10-CM | POA: Insufficient documentation

## 2011-03-28 DIAGNOSIS — S335XXA Sprain of ligaments of lumbar spine, initial encounter: Secondary | ICD-10-CM | POA: Insufficient documentation
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
## 2011-03-29 ENCOUNTER — Emergency Department (HOSPITAL_COMMUNITY): Payer: No Typology Code available for payment source

## 2011-03-29 MED ORDER — HYDROCODONE-ACETAMINOPHEN 5-325 MG PO TABS: 1.0000 | ORAL_TABLET | Freq: Four times a day (QID) | ORAL | Status: AC | PRN

## 2011-03-29 MED ORDER — IBUPROFEN 600 MG PO TABS: 600.0000 mg | ORAL_TABLET | Freq: Four times a day (QID) | ORAL | Status: AC | PRN

## 2011-03-29 MED ORDER — TRAMADOL HCL 50 MG PO TABS
50.0000 mg | ORAL_TABLET | Freq: Once | ORAL | Status: AC
  Administered 2011-03-29: 50 mg via ORAL
  Filled 2011-03-29 (×2): qty 1

## 2011-03-29 NOTE — ED Notes (Signed)
Pt back from x-ray.
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
## 2011-03-29 NOTE — ED Notes (Signed)
Pt stable. Ready for d/c. Pt to drive herself home.

## 2011-03-29 NOTE — ED Notes (Signed)
Awaiting Lumbar xray after Urine sample

## 2011-05-07 ENCOUNTER — Emergency Department (INDEPENDENT_AMBULATORY_CARE_PROVIDER_SITE_OTHER)
Admission: EM | Admit: 2011-05-07 | Discharge: 2011-05-07 | Disposition: A | Discharge status: Home / Self Care | Payer: Self-pay | Source: Home / Self Care

## 2011-05-07 ENCOUNTER — Encounter (HOSPITAL_COMMUNITY): Payer: Self-pay

## 2011-05-07 DIAGNOSIS — R51 Headache: Secondary | ICD-10-CM

## 2011-05-07 DIAGNOSIS — R1110 Vomiting, unspecified: Secondary | ICD-10-CM

## 2011-05-07 DIAGNOSIS — H8110 Benign paroxysmal vertigo, unspecified ear: Secondary | ICD-10-CM

## 2011-05-07 MED ORDER — DIPHENHYDRAMINE HCL 50 MG/ML IJ SOLN
50.0000 mg | Freq: Once | INTRAMUSCULAR | Status: AC
  Administered 2011-05-07: 50 mg via INTRAMUSCULAR

## 2011-05-07 MED ORDER — DIPHENHYDRAMINE HCL 50 MG/ML IJ SOLN
INTRAMUSCULAR | Status: AC
  Filled 2011-05-07: qty 1

## 2011-05-07 MED ORDER — KETOROLAC TROMETHAMINE 60 MG/2ML IM SOLN
INTRAMUSCULAR | Status: AC
  Filled 2011-05-07: qty 2

## 2011-05-07 MED ORDER — MECLIZINE HCL 25 MG PO TABS: 25.0000 mg | ORAL_TABLET | Freq: Three times a day (TID) | ORAL | Status: AC | PRN

## 2011-05-07 MED ORDER — PROMETHAZINE HCL 12.5 MG PO TABS: 12.5000 mg | ORAL_TABLET | Freq: Four times a day (QID) | ORAL | Status: DC | PRN

## 2011-05-07 MED ORDER — ONDANSETRON HCL 4 MG/2ML IJ SOLN
INTRAMUSCULAR | Status: AC
  Filled 2011-05-07: qty 2

## 2011-05-07 MED ORDER — KETOROLAC TROMETHAMINE 60 MG/2ML IM SOLN
60.0000 mg | Freq: Once | INTRAMUSCULAR | Status: AC
  Administered 2011-05-07: 60 mg via INTRAMUSCULAR

## 2011-05-07 MED ORDER — ONDANSETRON HCL 4 MG/2ML IJ SOLN
4.0000 mg | Freq: Once | INTRAMUSCULAR | Status: AC
  Administered 2011-05-07: 4 mg via INTRAMUSCULAR

## 2011-05-07 NOTE — ED Notes (Addendum)
Pt woke this am and had dizziness and started vomiting, has continued throughout the day.  Also has headache.

## 2011-05-07 NOTE — ED Provider Notes (Signed)
Medical screening examination/treatment/procedure(s) were performed by a resident physician and as supervising physician I was immediately available for consultation/collaboration.  Kamaal Cast, M.D.   Magaly Pollina C Barbie Croston, MD 05/07/11 2144 

## 2011-05-07 NOTE — ED Provider Notes (Signed)
Robin [name] is a 41 y.o. female who presents to Urgent Care today for vertigo and vomiting.  Patient awoke this morning at 6:30 and developed significant vertigo and around 8. She notes extreme vertigo when she turns her head but no vertigo when she holds her head still. She has vomited many times today as result of the nausea brought on by the vertigo.  This afternoon she has slowly developed a headache.  She denies any vision changes loss of coordination or difficulty walking.  She has had a history of vertigo in the past when she was pregnant.  She feels pretty miserable with the nausea and vomiting. No difficulty hearing or ringing in her ears.   PMH reviewed. Significant for prior episodes of vertigo ROS as above otherwise neg.  no chest pains, palpitations, fevers, chills, abdominal pain nausea or vomiting. Medications reviewed. Current Facility-Administered Medications  Medication Dose Route Frequency Provider Last Rate Last Dose  . diphenhydrAMINE (BENADRYL) injection 50 mg  50 mg Intramuscular Once Rodolph Bong, MD      . ketorolac (TORADOL) injection 60 mg  60 mg Intramuscular Once Rodolph Bong, MD      . ondansetron [facility] injection 4 mg  4 mg Intramuscular Once Rodolph Bong, MD       Current Outpatient Prescriptions  Medication Sig Dispense Refill  . acetaminophen (TYLENOL) 500 MG tablet Take 500 mg by mouth every 6 (six) hours as needed. For pain      . meclizine (ANTIVERT) 25 MG tablet Take 1 tablet (25 mg total) by mouth 3 (three) times daily as needed.  30 tablet  0  . promethazine (PHENERGAN) 12.5 MG tablet Take 1 tablet (12.5 mg total) by mouth every 6 (six) hours as needed for nausea.  30 tablet  0    Exam:  BP 154/94  Pulse 83  Temp(Src) 99.4 F (37.4 C) (Oral)  Resp 19  SpO2 100%  LMP 05/02/2011 Gen: Well NAD HEENT: EOMI,  MMM pupils equal round reactive to light. No resting nystagmus. Tympanic membranes are normal bilaterally area of Lungs: CTABL Nl  WOB Heart: RRR no MRG Abd: NABS, NT, ND Exts: Non edematous BL  LE, warm and well perfused. Neuro: Alert and oriented x3 cranial nerves II through XII are intact sensation and coordination are intact.   No results found for this or any previous visit (from the past 24 hour(s)). No results found.  Assessment and Plan: 41 y.o. female with likely benign paroxysmal positional vertigo and vomiting.   Patient is unable to tolerate the Dix-Hallpike maneuver as she becomes intensely vertiginous and vomits with sharp head rotation.  She has no resting nystagmus nor changes in hearing tinnitus or changes in vision.  I think BPPV can explain the nausea and vomiting. I believe that she has been vomiting most of today which is giving her a headache.  She doesn't have any focal neurologic symptoms that would make me think of intracranial abnormality.    Plan to treat with IM Zofran for nausea, IM Toradol and Benadryl for headache.  Plan to prescribe meclizine and Phenergan for nausea and vertigo at home.  Discussed the diagnosis with the patient and the Epley maneuver.  Discussed warning signs or symptoms such as changes in vision worsening headache uncontrollable vomiting. She expresses understanding and will followup with her primary care doctor on Monday or return to the emergency room if not better.     Rodolph Bong, MD 05/07/11 2011

## 2011-05-07 NOTE — Discharge Instructions (Signed)
Thank you for coming in today. Please take the phenergan when you get home.  Use the meclizine for the spinning sensation.  You have benign paroxysmal positional vertigo.  Look up epley maneuver on youtube. It is an movement to help with the vertigo.  Go to the emergency room if you cannot stop throwing up or your headache becomes much worse.

## 2011-05-07 NOTE — ED Notes (Signed)
Pt unable to tolerate lying position for obtaining orthostatic vs.

## 2013-08-08 ENCOUNTER — Emergency Department (HOSPITAL_COMMUNITY)
Admission: EM | Admit: 2013-08-08 | Discharge: 2013-08-09 | Disposition: A | Discharge status: Home / Self Care | Payer: Medicaid Other | Attending: Emergency Medicine | Admitting: Emergency Medicine

## 2013-08-08 ENCOUNTER — Encounter (HOSPITAL_COMMUNITY): Payer: Self-pay | Admitting: Emergency Medicine

## 2013-08-08 DIAGNOSIS — R42 Dizziness and giddiness: Secondary | ICD-10-CM | POA: Diagnosis present

## 2013-08-08 DIAGNOSIS — R112 Nausea with vomiting, unspecified: Secondary | ICD-10-CM | POA: Insufficient documentation

## 2013-08-08 DIAGNOSIS — H81399 Other peripheral vertigo, unspecified ear: Secondary | ICD-10-CM | POA: Insufficient documentation

## 2013-08-08 DIAGNOSIS — Z3202 Encounter for pregnancy test, result negative: Secondary | ICD-10-CM | POA: Diagnosis not present

## 2013-08-08 HISTORY — DX: Dizziness and giddiness: R42

## 2013-08-08 NOTE — ED Notes (Signed)
Pt complains of dizziness all day, tried OTC dizzy medication with no relief

## 2013-08-09 ENCOUNTER — Encounter (HOSPITAL_COMMUNITY): Payer: Self-pay | Admitting: Emergency Medicine

## 2013-08-09 LAB — CBC WITH DIFFERENTIAL/PLATELET
BASOS ABS: 0 10*3/uL (ref 0.0–0.1)
BASOS PCT: 0 % (ref 0–1)
EOS PCT: 0 % (ref 0–5)
Eosinophils Absolute: 0 10*3/uL (ref 0.0–0.7)
HEMATOCRIT: 43.5 % (ref 36.0–46.0)
Hemoglobin: 15.1 g/dL — ABNORMAL HIGH (ref 12.0–15.0)
Lymphocytes Relative: 23 % (ref 12–46)
Lymphs Abs: 1.3 10*3/uL (ref 0.7–4.0)
MCH: 33.4 pg (ref 26.0–34.0)
MCHC: 34.7 g/dL (ref 30.0–36.0)
MCV: 96.2 fL (ref 78.0–100.0)
MONO ABS: 0.6 10*3/uL (ref 0.1–1.0)
Monocytes Relative: 11 % (ref 3–12)
NEUTROS ABS: 3.7 10*3/uL (ref 1.7–7.7)
Neutrophils Relative %: 66 % (ref 43–77)
PLATELETS: 218 10*3/uL (ref 150–400)
RBC: 4.52 MIL/uL (ref 3.87–5.11)
RDW: 11.8 % (ref 11.5–15.5)
WBC: 5.6 10*3/uL (ref 4.0–10.5)

## 2013-08-09 LAB — BASIC METABOLIC PANEL
ANION GAP: 13 (ref 5–15)
BUN: 10 mg/dL (ref 6–23)
CALCIUM: 9.1 mg/dL (ref 8.4–10.5)
CO2: 26 mEq/L (ref 19–32)
CREATININE: 0.9 mg/dL (ref 0.50–1.10)
Chloride: 100 mEq/L (ref 96–112)
GFR calc non Af Amer: 77 mL/min — ABNORMAL LOW (ref >90)
GFR, EST AFRICAN AMERICAN: 89 mL/min — AB (ref >90)
Glucose, Bld: 121 mg/dL — ABNORMAL HIGH (ref 70–99)
Potassium: 4 mEq/L (ref 3.7–5.3)
Sodium: 139 mEq/L (ref 137–147)

## 2013-08-09 LAB — URINALYSIS, ROUTINE W REFLEX MICROSCOPIC
BILIRUBIN URINE: NEGATIVE
Glucose, UA: NEGATIVE mg/dL
Hgb urine dipstick: NEGATIVE
KETONES UR: NEGATIVE mg/dL
Leukocytes, UA: NEGATIVE
NITRITE: NEGATIVE
PROTEIN: NEGATIVE mg/dL
Specific Gravity, Urine: 1.025 (ref 1.005–1.030)
UROBILINOGEN UA: 1 mg/dL (ref 0.0–1.0)
pH: 7 (ref 5.0–8.0)

## 2013-08-09 LAB — PREGNANCY, URINE: Preg Test, Ur: NEGATIVE

## 2013-08-09 MED ORDER — DIPHENHYDRAMINE HCL 50 MG/ML IJ SOLN
50.0000 mg | Freq: Once | INTRAMUSCULAR | Status: AC
  Administered 2013-08-09: 50 mg via INTRAVENOUS
  Filled 2013-08-09: qty 1

## 2013-08-09 MED ORDER — SODIUM CHLORIDE 0.9 % IV BOLUS (SEPSIS)
1000.0000 mL | Freq: Once | INTRAVENOUS | Status: AC
  Administered 2013-08-09: 1000 mL via INTRAVENOUS

## 2013-08-09 MED ORDER — ONDANSETRON HCL 8 MG PO TABS: 8.0000 mg | ORAL_TABLET | Freq: Three times a day (TID) | ORAL | Status: DC | PRN

## 2013-08-09 MED ORDER — ACETAMINOPHEN 500 MG PO TABS
1000.0000 mg | ORAL_TABLET | Freq: Once | ORAL | Status: AC
  Administered 2013-08-09: 1000 mg via ORAL
  Filled 2013-08-09: qty 2

## 2013-08-09 MED ORDER — ONDANSETRON HCL 4 MG/2ML IJ SOLN
4.0000 mg | Freq: Once | INTRAMUSCULAR | Status: AC
  Administered 2013-08-09: 4 mg via INTRAVENOUS
  Filled 2013-08-09: qty 2

## 2013-08-09 MED ORDER — MECLIZINE HCL 50 MG PO TABS: 50.0000 mg | ORAL_TABLET | Freq: Three times a day (TID) | ORAL | Status: DC | PRN

## 2013-08-09 NOTE — Discharge Instructions (Signed)

## 2013-08-09 NOTE — ED Provider Notes (Signed)
CSN: [id number]     Arrival date & time 08/08/13  2306 History   First MD Initiated Contact with Patient 08/09/13 0038     Chief Complaint  Patient presents with  . Dizziness     (Consider location/radiation/quality/duration/timing/severity/associated sxs/prior Treatment) HPI [name] with a history of vertigo. She is here with dizziness that began yesterday morning about 6 AM. She describes it as a sensation of the room spinning. Symptoms are moderate to severe. Symptoms are worse with movement of the head. She took an over-the-counter and he motion sickness drug yesterday about 2 PM without relief. She has had nausea and vomiting and has vomited about 4 times. She is also complaining of a headache. Her symptoms are improved when she lies on her right side.  Past Medical History  Diagnosis Date  . Vertigo    Past Surgical History  Procedure Laterality Date  . Cesarian      x2   Family History  Problem Relation Age of Onset  . Hypertension Other   . Diabetes Other    History  Substance Use Topics  . Smoking status: Never Smoker   . Smokeless tobacco: Not on file  . Alcohol Use: No     Comment: social   OB History   Grav Para Term Preterm Abortions TAB SAB Ect Mult Living                 Review of Systems  All other systems reviewed and are negative.   Allergies  Review of patient's allergies indicates no known allergies.  Home Medications   Prior to Admission medications   Medication Sig Start Date End Date Taking? Authorizing Provider  ibuprofen (ADVIL,MOTRIN) 200 MG tablet Take 400 mg by mouth every 6 (six) hours as needed for mild pain.   Yes Historical Provider, MD   BP 166/85  Pulse 80  Temp(Src) 98.2 F (36.8 C) (Oral)  Resp 20  SpO2 100%  LMP 07/31/2013  Physical Exam General: Well-developed, well-nourished female in no acute distress; appearance consistent with age of record HENT: normocephalic; atraumatic; TMs normal Eyes:  pupils equal, round and reactive to light; extraocular muscles intact; no nystagmus Neck: supple Heart: regular rate and rhythm Lungs: clear to auscultation bilaterally Abdomen: soft; nondistended; nontender Extremities: No deformity; full range of motion; pulses normal Neurologic: Awake, alert and oriented; motor function intact in all extremities and symmetric; no facial droop Skin: Warm and dry Psychiatric: Normal mood and affect    ED Course  Procedures (including critical care time)   MDM   Nursing notes and vitals signs, including pulse oximetry, reviewed.  Summary of this visit's results, reviewed by myself:  Labs:  Results for orders placed during the hospital encounter of 08/08/13 (from the past 24 hour(s))  CBC WITH DIFFERENTIAL     Status: Abnormal   Collection Time    08/09/13 12:11 AM      Result Value Ref Range   WBC 5.6  4.0 - 10.5 K/uL   RBC 4.52  3.87 - 5.11 MIL/uL   Hemoglobin 15.1 (*) 12.0 - 15.0 g/dL   HCT 14.743.5  82.936.0 - 56.246.0 %   MCV 96.2  78.0 - 100.0 fL   MCH 33.4  26.0 - 34.0 pg   MCHC 34.7  30.0 - 36.0 g/dL   RDW 13.011.8  86.511.5 - 78.415.5 %   Platelets 218  150 - 400 K/uL   Neutrophils Relative % 66  43 - 77 %  Neutro Abs 3.7  1.7 - 7.7 K/uL   Lymphocytes Relative 23  12 - 46 %   Lymphs Abs 1.3  0.7 - 4.0 K/uL   Monocytes Relative 11  3 - 12 %   Monocytes Absolute 0.6  0.1 - 1.0 K/uL   Eosinophils Relative 0  0 - 5 %   Eosinophils Absolute 0.0  0.0 - 0.7 K/uL   Basophils Relative 0  0 - 1 %   Basophils Absolute 0.0  0.0 - 0.1 K/uL  BASIC METABOLIC PANEL     Status: Abnormal   Collection Time    08/09/13 12:11 AM      Result Value Ref Range   Sodium 139  137 - 147 mEq/L   Potassium 4.0  3.7 - 5.3 mEq/L   Chloride 100  96 - 112 mEq/L   CO2 26  19 - 32 mEq/L   Glucose, Bld 121 (*) 70 - 99 mg/dL   BUN 10  6 - 23 mg/dL   Creatinine, Ser 1.91  0.50 - 1.10 mg/dL   Calcium 9.1  8.4 - 47.8 mg/dL   GFR calc non Af Amer 77 (*) >90 mL/min   GFR calc Af  Amer 89 (*) >90 mL/min   Anion gap 13  5 - 15  URINALYSIS, ROUTINE W REFLEX MICROSCOPIC     Status: Abnormal   Collection Time    08/09/13 12:22 AM      Result Value Ref Range   Color, Urine YELLOW  YELLOW   APPearance CLOUDY (*) CLEAR   Specific Gravity, Urine 1.025  1.005 - 1.030   pH 7.0  5.0 - 8.0   Glucose, UA NEGATIVE  NEGATIVE mg/dL   Hgb urine dipstick NEGATIVE  NEGATIVE   Bilirubin Urine NEGATIVE  NEGATIVE   Ketones, ur NEGATIVE  NEGATIVE mg/dL   Protein, ur NEGATIVE  NEGATIVE mg/dL   Urobilinogen, UA 1.0  0.0 - 1.0 mg/dL   Nitrite NEGATIVE  NEGATIVE   Leukocytes, UA NEGATIVE  NEGATIVE  PREGNANCY, URINE     Status: None   Collection Time    08/09/13 12:22 AM      Result Value Ref Range   Preg Test, Ur NEGATIVE  NEGATIVE   1:57 AM Significant relief after IV medications.   Hanley Seamen, MD 08/09/13 0157

## 2014-03-10 ENCOUNTER — Emergency Department (HOSPITAL_COMMUNITY)
Admission: EM | Admit: 2014-03-10 | Discharge: 2014-03-11 | Disposition: A | Discharge status: Home / Self Care | Payer: Medicaid Other | Attending: Emergency Medicine | Admitting: Emergency Medicine

## 2014-03-10 ENCOUNTER — Encounter (HOSPITAL_COMMUNITY): Payer: Self-pay | Admitting: *Deleted

## 2014-03-10 DIAGNOSIS — K047 Periapical abscess without sinus: Secondary | ICD-10-CM | POA: Insufficient documentation

## 2014-03-10 DIAGNOSIS — K088 Other specified disorders of teeth and supporting structures: Secondary | ICD-10-CM | POA: Insufficient documentation

## 2014-03-10 DIAGNOSIS — K0889 Other specified disorders of teeth and supporting structures: Secondary | ICD-10-CM

## 2014-03-10 MED ORDER — ONDANSETRON 8 MG PO TBDP
8.0000 mg | ORAL_TABLET | Freq: Once | ORAL | Status: AC
  Administered 2014-03-10: 8 mg via ORAL
  Filled 2014-03-10: qty 1

## 2014-03-10 MED ORDER — OXYCODONE-ACETAMINOPHEN 5-325 MG PO TABS
2.0000 | ORAL_TABLET | Freq: Once | ORAL | Status: AC
  Administered 2014-03-10: 2 via ORAL
  Filled 2014-03-10: qty 2

## 2014-03-10 NOTE — ED Provider Notes (Signed)
CSN: [id number]     Arrival date & time 03/10/14  2302 History  This chart was scribed for Elpidio Anis, PA, working with Tomasita Crumble, MD by Elon Spanner, ED Scribe. This patient was seen in room WTR9/WTR9 and the patient's care was started at 11:21 PM.   No chief complaint on file.  The history is provided by the patient. No language interpreter was used.    HPI Comments: Robin [name] is a 44 y.o. female who presents to the Emergency Department complaining of right upper dental pain aggravated by swallowing onset yesterday.  Patient was seen today by a dentist who told the patient she had an infection and prescribed amoxicillin and ibuprofen.  The patient reports that the pain has continued and worsened since this time.  Additionally, she reports that taking the prescribed medicines has been following by episodes of nausea and vomiting.  Patient denies fever.    Past Medical History  Diagnosis Date  . Vertigo    Past Surgical History  Procedure Laterality Date  . Cesarian      x2   Family History  Problem Relation Age of Onset  . Hypertension Other   . Diabetes Other    History  Substance Use Topics  . Smoking status: Never Smoker   . Smokeless tobacco: Not on file  . Alcohol Use: No     Comment: social   OB History    No data available     Review of Systems  Constitutional: Negative for fever.  HENT: Positive for dental problem.       Allergies  Review of patient's allergies indicates no known allergies.  Home Medications   Prior to Admission medications   Medication Sig Start Date End Date Taking? Authorizing Provider  ibuprofen (ADVIL,MOTRIN) 200 MG tablet Take 400 mg by mouth every 6 (six) hours as needed for mild pain.    Historical Provider, MD  meclizine (ANTIVERT) 50 MG tablet Take 1 tablet (50 mg total) by mouth 3 (three) times daily as needed for dizziness. 08/09/13   Carlisle Beers Molpus, MD  ondansetron (ZOFRAN) 8 MG tablet Take 1 tablet (8 mg total) by  mouth every 8 (eight) hours as needed for nausea or vomiting. 08/09/13   Carlisle Beers Molpus, MD   There were no vitals taken for this visit. Physical Exam  Constitutional: She is oriented to person, place, and time. She appears well-developed and well-nourished. No distress.  HENT:  Head: Normocephalic and atraumatic.  Significant swelling adjacent to number 2 consistent with dental abscess.    Eyes: Conjunctivae and EOM are normal.  Neck: Neck supple. No tracheal deviation present.  Cardiovascular: Normal rate.   Pulmonary/Chest: Effort normal. No respiratory distress.  Musculoskeletal: Normal range of motion.  Neurological: She is alert and oriented to person, place, and time.  Skin: Skin is warm and dry.  Psychiatric: She has a normal mood and affect. Her behavior is normal.  Nursing note and vitals reviewed.   ED Course  Procedures (including critical care time)  DIAGNOSTIC STUDIES:   COORDINATION OF CARE:    Labs Review Labs Reviewed - No data to display  Imaging Review No results found.   EKG Interpretation None      MDM   Final diagnoses:  None    1. Dental pain  The patient's pain has been difficult to control. She had subsequent vomiting with bleeding visualized in later episodes. IV started - bolus given. Labs reassuring.   No dental abscess  that is amendable to I&D visualized.   Pain managed. Nausea improved, no further vomiting. PO challenge without further vomiting. No fever. Feel she can be discharged home to follow up with dentistry tomorrow.  I personally performed the services described in this documentation, which was scribed in my presence. The recorded information has been reviewed and is accurate.     Arnoldo HookerShari A Anwen Cannedy, PA-C 03/11/14 [id number]  Tomasita CrumbleAdeleke Oni, MD 03/11/14 [phone]

## 2014-03-10 NOTE — ED Notes (Signed)
Pt reports acute onset of rt lower dental pain that began Sunday evening, pt was seen by her dentist today and given antibiotics. Pt in tonight w/ severe pain.

## 2014-03-11 ENCOUNTER — Encounter (HOSPITAL_COMMUNITY): Payer: Self-pay | Admitting: *Deleted

## 2014-03-11 ENCOUNTER — Emergency Department (HOSPITAL_COMMUNITY)
Admission: EM | Admit: 2014-03-11 | Discharge: 2014-03-12 | Disposition: A | Discharge status: Home / Self Care | Payer: Medicaid Other | Source: Home / Self Care | Attending: Emergency Medicine | Admitting: Emergency Medicine

## 2014-03-11 DIAGNOSIS — K0889 Other specified disorders of teeth and supporting structures: Secondary | ICD-10-CM

## 2014-03-11 LAB — CBC WITH DIFFERENTIAL/PLATELET
BASOS ABS: 0 10*3/uL (ref 0.0–0.1)
BASOS PCT: 0 % (ref 0–1)
EOS ABS: 0 10*3/uL (ref 0.0–0.7)
Eosinophils Relative: 0 % (ref 0–5)
HEMATOCRIT: 40.7 % (ref 36.0–46.0)
HEMOGLOBIN: 13.4 g/dL (ref 12.0–15.0)
Lymphocytes Relative: 11 % — ABNORMAL LOW (ref 12–46)
Lymphs Abs: 0.9 10*3/uL (ref 0.7–4.0)
MCH: 32.3 pg (ref 26.0–34.0)
MCHC: 32.9 g/dL (ref 30.0–36.0)
MCV: 98.1 fL (ref 78.0–100.0)
MONO ABS: 0.8 10*3/uL (ref 0.1–1.0)
MONOS PCT: 10 % (ref 3–12)
NEUTROS PCT: 79 % — AB (ref 43–77)
Neutro Abs: 6.5 10*3/uL (ref 1.7–7.7)
Platelets: 216 10*3/uL (ref 150–400)
RBC: 4.15 MIL/uL (ref 3.87–5.11)
RDW: 12.3 % (ref 11.5–15.5)
WBC: 8.2 10*3/uL (ref 4.0–10.5)

## 2014-03-11 LAB — BASIC METABOLIC PANEL
Anion gap: 7 (ref 5–15)
BUN: 10 mg/dL (ref 6–23)
CHLORIDE: 100 mmol/L (ref 96–112)
CO2: 30 mmol/L (ref 19–32)
Calcium: 8.7 mg/dL (ref 8.4–10.5)
Creatinine, Ser: 0.83 mg/dL (ref 0.50–1.10)
GFR calc Af Amer: >90 mL/min (ref >90)
GFR, EST NON AFRICAN AMERICAN: 85 mL/min — AB (ref >90)
GLUCOSE: 153 mg/dL — AB (ref 70–99)
POTASSIUM: 3.2 mmol/L — AB (ref 3.5–5.1)
SODIUM: 137 mmol/L (ref 135–145)

## 2014-03-11 MED ORDER — BUPIVACAINE-EPINEPHRINE (PF) 0.5% -1:200000 IJ SOLN
1.8000 mL | Freq: Once | INTRAMUSCULAR | Status: AC
  Administered 2014-03-11: 1.8 mL
  Filled 2014-03-11: qty 1.8

## 2014-03-11 MED ORDER — OXYCODONE-ACETAMINOPHEN 5-325 MG PO TABS: 1.0000 | ORAL_TABLET | ORAL | Status: DC | PRN

## 2014-03-11 MED ORDER — PENICILLIN V POTASSIUM 500 MG PO TABS
500.0000 mg | ORAL_TABLET | Freq: Once | ORAL | Status: AC
  Administered 2014-03-11: 500 mg via ORAL
  Filled 2014-03-11: qty 1

## 2014-03-11 MED ORDER — MORPHINE SULFATE 4 MG/ML IJ SOLN
4.0000 mg | Freq: Once | INTRAMUSCULAR | Status: AC
  Administered 2014-03-11: 4 mg via INTRAVENOUS
  Filled 2014-03-11: qty 1

## 2014-03-11 MED ORDER — ONDANSETRON HCL 4 MG/2ML IJ SOLN
4.0000 mg | Freq: Once | INTRAMUSCULAR | Status: AC
  Administered 2014-03-11: 4 mg via INTRAVENOUS
  Filled 2014-03-11: qty 2

## 2014-03-11 MED ORDER — ONDANSETRON 8 MG PO TBDP: 8.0000 mg | ORAL_TABLET | Freq: Three times a day (TID) | ORAL | Status: DC | PRN

## 2014-03-11 MED ORDER — NAPROXEN 500 MG PO TABS: 500.0000 mg | ORAL_TABLET | Freq: Two times a day (BID) | ORAL | Status: DC

## 2014-03-11 MED ORDER — HYDROCODONE-ACETAMINOPHEN 5-325 MG PO TABS: 1.0000 | ORAL_TABLET | ORAL | Status: DC | PRN

## 2014-03-11 MED ORDER — HYDROMORPHONE HCL 2 MG/ML IJ SOLN
2.0000 mg | Freq: Once | INTRAMUSCULAR | Status: AC
  Administered 2014-03-11: 2 mg via INTRAMUSCULAR
  Filled 2014-03-11: qty 1

## 2014-03-11 MED ORDER — ONDANSETRON HCL 4 MG/2ML IJ SOLN
4.0000 mg | INTRAMUSCULAR | Status: AC
  Administered 2014-03-11: 4 mg via INTRAMUSCULAR
  Filled 2014-03-11: qty 2

## 2014-03-11 MED ORDER — NAPROXEN 500 MG PO TABS
500.0000 mg | ORAL_TABLET | Freq: Once | ORAL | Status: AC
  Administered 2014-03-11: 500 mg via ORAL
  Filled 2014-03-11: qty 1

## 2014-03-11 MED ORDER — KETOROLAC TROMETHAMINE 60 MG/2ML IM SOLN
60.0000 mg | Freq: Once | INTRAMUSCULAR | Status: AC
  Administered 2014-03-11: 60 mg via INTRAMUSCULAR
  Filled 2014-03-11: qty 2

## 2014-03-11 MED ORDER — OXYCODONE-ACETAMINOPHEN 5-325 MG PO TABS
1.0000 | ORAL_TABLET | Freq: Once | ORAL | Status: AC
  Administered 2014-03-11: 1 via ORAL
  Filled 2014-03-11: qty 1

## 2014-03-11 MED ORDER — SODIUM CHLORIDE 0.9 % IV BOLUS (SEPSIS)
1000.0000 mL | Freq: Once | INTRAVENOUS | Status: AC
  Administered 2014-03-11: 1000 mL via INTRAVENOUS

## 2014-03-11 NOTE — ED Notes (Signed)
Went to discharge patient, she is requesting additional pain medication for pain. She reports the dental nerve block didn't take all the pain away. Notified provider. New orders obtained.

## 2014-03-11 NOTE — ED Notes (Signed)
Pt continues to experience n/v x2 episodes - minimal relief in pain. Elpidio AnisShari Upstill, PAC made aware.

## 2014-03-11 NOTE — ED Provider Notes (Signed)
CSN: [id number]     Arrival date & time 03/11/14  2130 History  This chart was scribed for non-physician practitioner, Harle Battiest, NP working with Tilden Fossa, MD by Gwenyth Ober, ED scribe. This patient was seen in room WTR8/WTR8 and the patient's care was started at 10:19 PM  Chief Complaint  Patient presents with  . Dental Pain   The history is provided by the patient. No language interpreter was used.    HPI Comments: Robin [name] is a 44 y.o. female who presents to the Emergency Department complaining of constant, moderate right lower dental pain that started 2 days ago. She states mild facial swelling, nausea and vomiting due to pain as associated symptoms. Pt reports vomiting occurs shortly after food or fluid intake. Pt was seen in the ED yesterday for the same pain and was prescribed Percocet and Zofran. She also saw a dentist yesterday who prescribed penicillin and Ibuprofen.   Past Medical History  Diagnosis Date  . Vertigo    Past Surgical History  Procedure Laterality Date  . Cesarian      x2   Family History  Problem Relation Age of Onset  . Hypertension Other   . Diabetes Other    History  Substance Use Topics  . Smoking status: Never Smoker   . Smokeless tobacco: Not on file  . Alcohol Use: No     Comment: social   OB History    No data available     Review of Systems  HENT: Positive for dental problem and facial swelling.   Gastrointestinal: Positive for nausea and vomiting.    Allergies  Review of patient's allergies indicates no known allergies.  Home Medications   Prior to Admission medications   Medication Sig Start Date End Date Taking? Authorizing Provider  ibuprofen (ADVIL,MOTRIN) 200 MG tablet Take 400 mg by mouth every 6 (six) hours as needed for mild pain.    Historical Provider, MD  meclizine (ANTIVERT) 50 MG tablet Take 1 tablet (50 mg total) by mouth 3 (three) times daily as needed for dizziness. Patient not taking:  Reported on 03/11/2014 08/09/13   Carlisle Beers Molpus, MD  ondansetron (ZOFRAN ODT) 8 MG disintegrating tablet Take 1 tablet (8 mg total) by mouth every 8 (eight) hours as needed for nausea or vomiting. 03/11/14   Shari A Upstill, PA-C  ondansetron (ZOFRAN) 8 MG tablet Take 1 tablet (8 mg total) by mouth every 8 (eight) hours as needed for nausea or vomiting. Patient not taking: Reported on 03/11/2014 08/09/13   Carlisle Beers Molpus, MD  oxyCODONE-acetaminophen (PERCOCET/ROXICET) 5-325 MG per tablet Take 1-2 tablets by mouth every 4 (four) hours as needed for severe pain. 03/11/14   Arnoldo Hooker, PA-C   LMP 03/07/2014 (Exact Date) Physical Exam  Constitutional: She is oriented to person, place, and time. She appears well-developed and well-nourished. No distress.  HENT:  Head: Normocephalic and atraumatic.  Mouth/Throat: Oropharynx is clear and moist. No oropharyngeal exudate.  No facial swelling; mild cervical lymphadenopathy on the right; right 1st molar fractured; 3rd premolar is missing; mild swelling to buccal tissue next to that affected tooth  Eyes: Pupils are equal, round, and reactive to light.  Neck: Neck supple.  Cardiovascular: Normal rate.   Pulmonary/Chest: Effort normal.  Musculoskeletal: She exhibits no edema.  Neurological: She is alert and oriented to person, place, and time. No cranial nerve deficit.  Skin: Skin is warm and dry. No rash noted.  Psychiatric: She has a normal  mood and affect. Her behavior is normal.  Nursing note and vitals reviewed.   ED Course  Procedures   NERVE BLOCK Performed by: Harle Battiest Consent: Verbal consent obtained. Required items: required blood products, implants, devices, and special equipment available Time out: Immediately prior to procedure a "time out" was called to verify the correct patient, procedure, equipment, support staff and site/side marked as required.  Indication: dental pain  Nerve block body site: right inferior alveolar  nerve  Preparation: Patient was prepped and draped in the usual sterile fashion. Needle gauge: 27 G Location technique: anatomical landmarks  Local anesthetic:bupivicaine with epi  Anesthetic total: 1.8 ml  Outcome: pain improved Patient tolerance: Patient tolerated the procedure well with no immediate complications.  DIAGNOSTIC STUDIES: Oxygen Saturation is 99% on RA, normal by my interpretation.    COORDINATION OF CARE: 10:27 PM Discussed treatment plan with pt. She agreed to plan.  Labs Review Labs Reviewed - No data to display  Imaging Review No results found.   EKG Interpretation None      MDM   Final diagnoses:  Pain, dental   44 yo with recurrent dental pain with nausea and vomiting. There is no indication of  gross abscess, Ludwig's angina or spread of infection. Dental block performed with improvement in pain, however after taking abx in ED pt became nauseated again and vomited.  Ns bolus and zofran given, with improvement in symptoms. Discussed with pt changing pain meds and abx as possible causes of nausea and vomiting.  Additional dental resources provided for follow-up. Pt wanted to rest in ED before being discharged to ensure symptoms would not return.  After resting, pain managed in ED nausea managed and able to tolerate POs. Pt is well-appearing, in no acute distress and vital signs are stable.  They appear safe to be discharged.  Return precautions provided.      I personally performed the services described in this documentation, which was scribed in my presence. The recorded information has been reviewed and is accurate.  Filed Vitals:   03/11/14 2213 03/11/14 2215 03/12/14 0102 03/12/14 0244  BP:  161/84 160/86 169/94  Pulse:   82 78  Temp: 98.8 F (37.1 C)  98.6 F (37 C)   TempSrc:   Oral   Resp:   16 16  Height:  (1.499 m)     Weight: 160 lb (72.576 kg)     SpO2:   100% 97%   Meds given in ED:  Medications  bupivacaine-epinephrine  (MARCAINE W/ EPI) 0.5% -1:200000 injection 1.8 mL (1.8 mLs Infiltration Given by Other 03/11/14 2229)  ondansetron (ZOFRAN) injection 4 mg (4 mg Intramuscular Given 03/11/14 2230)  bupivacaine-epinephrine (MARCAINE W/ EPI) 0.5% -1:200000 injection 1.8 mL (1.8 mLs Infiltration Given by Other 03/11/14 2300)  HYDROmorphone (DILAUDID) injection 2 mg (2 mg Intramuscular Given 03/11/14 2323)  naproxen (NAPROSYN) tablet 500 mg (500 mg Oral Given 03/11/14 2323)  penicillin v potassium (VEETID) tablet 500 mg (500 mg Oral Given 03/11/14 2323)  sodium chloride 0.9 % bolus 1,000 mL (0 mLs Intravenous Stopped 03/12/14 0255)  ondansetron (ZOFRAN) injection 4 mg (4 mg Intravenous Given 03/12/14 0104)    Discharge Medication List as of 03/12/2014  1:44 AM    START taking these medications   Details  amoxicillin-clavulanate (AUGMENTIN) 875-125 MG per tablet Take 1 tablet by mouth every 12 (twelve) hours., Starting 03/12/2014, Until Discontinued, Print    HYDROcodone-acetaminophen (NORCO/VICODIN) 5-325 MG per tablet Take 1 tablet by mouth every  4 (four) hours as needed., Starting 03/11/2014, Until Discontinued, Print    naproxen (NAPROSYN) 500 MG tablet Take 1 tablet (500 mg total) by mouth 2 (two) times daily., Starting 03/11/2014, Until Discontinued, Print          Harle BattiestElizabeth Jasneet Schobert, NP 03/12/14 1755  Tilden FossaElizabeth Rees, MD 03/14/14 [phone]

## 2014-03-11 NOTE — ED Notes (Signed)
Patient is having right lower dental pain. [facility] same [facility]. Patient reports she has been taking medication as prescribed. Also, seen dentist on Monday for same symptoms.

## 2014-03-11 NOTE — ED Notes (Signed)
Pt was seen yesterday for tooth ache, was given antibiotics and percocet. Pt states she cannot keep her medication down due to nausea and vomiting. Pt now complains of nausea and dental pain. Pt took ODT zofran, which she states did not help.

## 2014-03-11 NOTE — Discharge Instructions (Signed)

## 2014-03-12 MED ORDER — AMOXICILLIN-POT CLAVULANATE 875-125 MG PO TABS: 1.0000 | ORAL_TABLET | Freq: Two times a day (BID) | ORAL | Status: DC

## 2014-03-12 MED ORDER — SODIUM CHLORIDE 0.9 % IV BOLUS (SEPSIS)
1000.0000 mL | Freq: Once | INTRAVENOUS | Status: AC
  Administered 2014-03-12: 1000 mL via INTRAVENOUS

## 2014-03-12 MED ORDER — ONDANSETRON HCL 4 MG/2ML IJ SOLN
4.0000 mg | INTRAMUSCULAR | Status: AC
  Administered 2014-03-12: 4 mg via INTRAVENOUS
  Filled 2014-03-12: qty 2

## 2014-03-12 NOTE — ED Notes (Signed)
Pt w/o any improvement in pain - admits to n/v Waynetta Sandy- Beth, PA made aware.

## 2014-03-12 NOTE — Discharge Instructions (Signed)
These follow the directions provided. Be sure to follow-up with your dentist for treatment of this tooth pain. Take your antibiotics until they're all gone. Please stop taking the Percocet as I think this is part of your nausea and vomiting. Please stop taking the penicillin and start taking the amoxicillin as an antibiotic.  You may continue to take the Zofran but please start taking the naproxen twice a day to help with inflammation. And you may try the Vicodin for pain not relieved by the naproxen. Don't hesitate to return for any new, worsening, or concerning symptoms.    SEEK IMMEDIATE MEDICAL CARE IF:  You have a fever.  You develop redness and swelling of your face, jaw, or neck.  You are unable to open your mouth.  You have severe pain uncontrolled by pain medicine.

## 2016-12-17 ENCOUNTER — Encounter (HOSPITAL_COMMUNITY): Payer: Self-pay | Admitting: Emergency Medicine

## 2016-12-17 ENCOUNTER — Emergency Department (HOSPITAL_COMMUNITY)
Admission: EM | Admit: 2016-12-17 | Discharge: 2016-12-17 | Disposition: A | Discharge status: Home / Self Care | Payer: Medicaid Other | Attending: Emergency Medicine | Admitting: Emergency Medicine

## 2016-12-17 DIAGNOSIS — Z79899 Other long term (current) drug therapy: Secondary | ICD-10-CM | POA: Insufficient documentation

## 2016-12-17 DIAGNOSIS — R112 Nausea with vomiting, unspecified: Secondary | ICD-10-CM | POA: Insufficient documentation

## 2016-12-17 DIAGNOSIS — R42 Dizziness and giddiness: Secondary | ICD-10-CM | POA: Diagnosis not present

## 2016-12-17 MED ORDER — SODIUM CHLORIDE 0.9 % IV BOLUS (SEPSIS)
1000.0000 mL | Freq: Once | INTRAVENOUS | Status: AC
  Administered 2016-12-17: 1000 mL via INTRAVENOUS

## 2016-12-17 MED ORDER — DIPHENHYDRAMINE HCL 50 MG/ML IJ SOLN
50.0000 mg | Freq: Once | INTRAMUSCULAR | Status: AC
  Administered 2016-12-17: 50 mg via INTRAVENOUS
  Filled 2016-12-17: qty 1

## 2016-12-17 MED ORDER — ONDANSETRON HCL 4 MG/2ML IJ SOLN
4.0000 mg | Freq: Once | INTRAMUSCULAR | Status: AC
  Administered 2016-12-17: 4 mg via INTRAVENOUS
  Filled 2016-12-17: qty 2

## 2016-12-17 MED ORDER — DIAZEPAM 5 MG PO TABS
5.0000 mg | ORAL_TABLET | Freq: Once | ORAL | Status: AC
  Administered 2016-12-17: 5 mg via ORAL
  Filled 2016-12-17: qty 1

## 2016-12-17 MED ORDER — KETOROLAC TROMETHAMINE 30 MG/ML IJ SOLN
30.0000 mg | Freq: Once | INTRAMUSCULAR | Status: AC
  Administered 2016-12-17: 30 mg via INTRAVENOUS
  Filled 2016-12-17: qty 1

## 2016-12-17 NOTE — Discharge Instructions (Signed)
Continue taking your meclizine as prescribed as needed for vertigo. Schedule an appointment with the ENT specialists. Return to the ER for new or concerning symptoms.

## 2016-12-17 NOTE — ED Provider Notes (Signed)
[facility]-EMERGENCY DEPT Provider Note   CSN: [id number] Arrival date & time: 12/17/16  1058     History   Chief Complaint Chief Complaint  Patient presents with  . Nausea  . Emesis  . Dizziness    HPI Robin [name] is a 46 y.o. female with past medical history of peripheral vertigo, presenting to the ED with worsening symptoms of her vertigo since this morning.  Patient states she woke up feeling room spinning dizzy and off-balance, with associated nausea and vomiting.  States symptoms are worse with any position changes or movement.  States she feels slightly better when lying on her left side.  Reports she took 50 mg of meclizine at home, however was unable to keep down and vomited it pretty soon after taking it.  Reports mild headache since the vomiting began, reports this is common for her when she has been vomiting.  Denies vision changes, abdominal pain, unilateral weakness or numbness, fever, URI symptoms, or other complaints.  Has not seen an ENT specialist regarding her vertigo.  The history is provided by the patient.    Past Medical History:  Diagnosis Date  . Vertigo     There are no active problems to display for this patient.   Past Surgical History:  Procedure Laterality Date  . cesarian     x2    OB History    No data available       Home Medications    Prior to Admission medications   Medication Sig Start Date End Date Taking? Authorizing Provider  acetaminophen (TYLENOL) 500 MG tablet Take 1,000 mg by mouth every 6 (six) hours as needed for mild pain.   Yes [provider]  meclizine (ANTIVERT) 50 MG tablet Take 1 tablet (50 mg total) by mouth 3 (three) times daily as needed for dizziness. 08/09/13  Yes Molpus, John, MD  tetrahydrozoline-zinc (VISINE-AC) 0.05-0.25 % ophthalmic solution Place 2 drops into both eyes 3 (three) times daily as needed (Itching).   Yes [provider]  amoxicillin-clavulanate  (AUGMENTIN) 875-125 MG per tablet Take 1 tablet by mouth every 12 (twelve) hours. Patient not taking: Reported on 12/17/2016 03/12/14   Harle Battiestysinger, Elizabeth, NP  HYDROcodone-acetaminophen (NORCO/VICODIN) 5-325 MG per tablet Take 1 tablet by mouth every 4 (four) hours as needed. Patient not taking: Reported on 12/17/2016 03/11/14   Harle Battiestysinger, Elizabeth, NP  naproxen (NAPROSYN) 500 MG tablet Take 1 tablet (500 mg total) by mouth 2 (two) times daily. Patient not taking: Reported on 12/17/2016 03/11/14   Harle Battiestysinger, Elizabeth, NP  ondansetron (ZOFRAN ODT) 8 MG disintegrating tablet Take 1 tablet (8 mg total) by mouth every 8 (eight) hours as needed for nausea or vomiting. Patient not taking: Reported on 12/17/2016 03/11/14   Elpidio AnisUpstill, Shari, PA-C  ondansetron (ZOFRAN) 8 MG tablet Take 1 tablet (8 mg total) by mouth every 8 (eight) hours as needed for nausea or vomiting. Patient not taking: Reported on 03/11/2014 08/09/13   Molpus, Jonny RuizJohn, MD    Family History Family History  Problem Relation Age of Onset  . Hypertension Other   . Diabetes Other     Social History Social History   Tobacco Use  . Smoking status: Never Smoker  . Smokeless tobacco: Never Used  Substance Use Topics  . Alcohol use: No    Comment: Usually drinks once a month  . Drug use: No     Allergies   Patient has no known allergies.   Review of Systems  Review of Systems  HENT: Negative for congestion and sore throat.   Eyes: Negative for visual disturbance.  Respiratory: Negative for cough.   Gastrointestinal: Positive for nausea and vomiting. Negative for abdominal pain.  Neurological: Positive for dizziness and headaches. Negative for syncope and weakness.  All other systems reviewed and are negative.    Physical Exam Updated Vital Signs BP (!) 163/91 (BP Location: Right Arm)   Pulse 68   Temp 98.1 F (36.7 C) (Oral)   Resp 18   SpO2 100%   Physical Exam  Constitutional: She is oriented to person, place, and time. She  appears well-developed and well-nourished. No distress.  Symptoms made worse with head movements  HENT:  Head: Normocephalic and atraumatic.  Mouth/Throat: Oropharynx is clear and moist.  Eyes: Conjunctivae are normal.  Neck: Normal range of motion. Neck supple.  Cardiovascular: Normal rate, regular rhythm, normal heart sounds and intact distal pulses.  Pulmonary/Chest: Effort normal and breath sounds normal. No stridor. No respiratory distress. She has no wheezes. She has no rales.  Abdominal: Soft. Bowel sounds are normal. She exhibits no distension. There is no tenderness. There is no rebound and no guarding.  Neurological: She is alert and oriented to person, place, and time.  Mental Status:  Alert, oriented, thought content appropriate, able to give a coherent history. Speech fluent without evidence of aphasia. Able to follow 2 step commands without difficulty.  Cranial Nerves:  II:  Peripheral visual fields grossly normal, pupils equal, round, reactive to light III,IV, VI: ptosis not present, extra-ocular motions intact bilaterally  V,VII: smile symmetric, facial light touch sensation equal VIII: hearing grossly normal to voice  X: uvula elevates symmetrically  XI: bilateral shoulder shrug symmetric and strong XII: midline tongue extension without fassiculations Motor:  Normal tone. 5/5 in upper and lower extremities bilaterally including strong and equal grip strength and dorsiflexion/plantar flexion Sensory: Pinprick and light touch normal in all extremities.  Deep Tendon Reflexes: 2+ and symmetric in the biceps and patella Cerebellar: normal finger-to-nose with bilateral upper extremities Gait: normal gait and balance CV: distal pulses palpable throughout    Skin: Skin is warm.  Psychiatric: She has a normal mood and affect. Her behavior is normal.  Nursing note and vitals reviewed.    ED Treatments / Results  Labs (all labs ordered are listed, but only abnormal results  are displayed) Labs Reviewed - No data to display  EKG  EKG Interpretation None       Radiology No results found.  Procedures Procedures (including critical care time)  Medications Ordered in ED Medications  sodium chloride 0.9 % bolus 1,000 mL (0 mLs Intravenous Stopped 12/17/16 1525)  diphenhydrAMINE (BENADRYL) injection 50 mg (50 mg Intravenous Given 12/17/16 1349)  ondansetron (ZOFRAN) injection 4 mg (4 mg Intravenous Given 12/17/16 1349)  ketorolac (TORADOL) 30 MG/ML injection 30 mg (30 mg Intravenous Given 12/17/16 1524)  diazepam (VALIUM) tablet 5 mg (5 mg Oral Given 12/17/16 1620)    Initial Impression / Assessment and Plan / ED Course  I have reviewed the triage vital signs and the nursing notes.  Pertinent labs & imaging results that were available during my care of the patient were reviewed by me and considered in my medical decision making (see chart for details).     Pt w past medical history of peripheral vertigo, presenting to the ED with acute onset of recurrent symptoms this morning.  Reports room spinning dizziness, imbalance, and nausea with vomiting.  Took meclizine at  home, however was unable to keep it down.  Normal neurologic exam.  Patient complaining of mild headache since she has been vomiting, which is normal for her.  Initially treated with IV fluids, Benadryl, Zofran with mild relief of symptoms, however still reporting some dizziness.  Toradol relieved her headache.  PO Valium was given with significant relief of dizziness. Patient will be discharged with ENT referral as she has never seen a specialist for her vertigo.  Safe for discharge at this time.  Patient discussed with Dr. Erma HeritageIsaacs.  Discussed results, findings, treatment and follow up. Patient advised of return precautions. Patient verbalized understanding and agreed with plan.  Final Clinical Impressions(s) / ED Diagnoses   Final diagnoses:  Vertigo    ED Discharge Orders    None         Raynelle Fujikawa, [place] N, PA-C 12/17/16 1757    Shaune PollackIsaacs, [place], MD 12/18/16 [phone]

## 2016-12-17 NOTE — ED Triage Notes (Signed)
Patient here from home with complaints of waking up early this morning with nausea, vomiting, dizziness. Hx of vertigo. Reports taking Meclizine with no relief.

## 2017-07-03 DIAGNOSIS — I10 Essential (primary) hypertension: Secondary | ICD-10-CM | POA: Insufficient documentation

## 2017-07-03 DIAGNOSIS — E669 Obesity, unspecified: Secondary | ICD-10-CM | POA: Insufficient documentation

## 2019-02-11 ENCOUNTER — Ambulatory Visit: Payer: Medicaid Other | Attending: Internal Medicine

## 2019-02-11 DIAGNOSIS — Z20822 Contact with and (suspected) exposure to covid-19: Secondary | ICD-10-CM

## 2019-02-12 LAB — NOVEL CORONAVIRUS, NAA: SARS-CoV-2, NAA: NOT DETECTED

## 2019-05-20 ENCOUNTER — Emergency Department (HOSPITAL_BASED_OUTPATIENT_CLINIC_OR_DEPARTMENT_OTHER)
Admission: EM | Admit: 2019-05-20 | Discharge: 2019-05-20 | Disposition: A | Discharge status: Home / Self Care | Payer: Medicaid Other | Attending: Emergency Medicine | Admitting: Emergency Medicine

## 2019-05-20 ENCOUNTER — Encounter (HOSPITAL_BASED_OUTPATIENT_CLINIC_OR_DEPARTMENT_OTHER): Payer: Self-pay | Admitting: *Deleted

## 2019-05-20 ENCOUNTER — Other Ambulatory Visit: Payer: Self-pay

## 2019-05-20 ENCOUNTER — Emergency Department (HOSPITAL_BASED_OUTPATIENT_CLINIC_OR_DEPARTMENT_OTHER): Payer: Medicaid Other

## 2019-05-20 DIAGNOSIS — Z20822 Contact with and (suspected) exposure to covid-19: Secondary | ICD-10-CM | POA: Diagnosis not present

## 2019-05-20 DIAGNOSIS — R439 Unspecified disturbances of smell and taste: Secondary | ICD-10-CM | POA: Diagnosis present

## 2019-05-20 LAB — CBC WITH DIFFERENTIAL/PLATELET
Abs Immature Granulocytes: 0.01 10*3/uL (ref 0.00–0.07)
Basophils Absolute: 0 10*3/uL (ref 0.0–0.1)
Basophils Relative: 0 %
Eosinophils Absolute: 0 10*3/uL (ref 0.0–0.5)
Eosinophils Relative: 0 %
HCT: 45 % (ref 36.0–46.0)
Hemoglobin: 15.6 g/dL — ABNORMAL HIGH (ref 12.0–15.0)
Immature Granulocytes: 0 %
Lymphocytes Relative: 29 %
Lymphs Abs: 1.2 10*3/uL (ref 0.7–4.0)
MCH: 33 pg (ref 26.0–34.0)
MCHC: 34.7 g/dL (ref 30.0–36.0)
MCV: 95.1 fL (ref 80.0–100.0)
Monocytes Absolute: 0.6 10*3/uL (ref 0.1–1.0)
Monocytes Relative: 15 %
Neutro Abs: 2.4 10*3/uL (ref 1.7–7.7)
Neutrophils Relative %: 56 %
Platelets: 171 10*3/uL (ref 150–400)
RBC: 4.73 MIL/uL (ref 3.87–5.11)
RDW: 11.4 % — ABNORMAL LOW (ref 11.5–15.5)
WBC: 4.2 10*3/uL (ref 4.0–10.5)
nRBC: 0 % (ref 0.0–0.2)

## 2019-05-20 LAB — BASIC METABOLIC PANEL
Anion gap: 13 (ref 5–15)
BUN: 10 mg/dL (ref 6–20)
CO2: 25 mmol/L (ref 22–32)
Calcium: 8.4 mg/dL — ABNORMAL LOW (ref 8.9–10.3)
Chloride: 99 mmol/L (ref 98–111)
Creatinine, Ser: 1.02 mg/dL — ABNORMAL HIGH (ref 0.44–1.00)
GFR calc Af Amer: >60 mL/min (ref >60)
GFR calc non Af Amer: >60 mL/min (ref >60)
Glucose, Bld: 113 mg/dL — ABNORMAL HIGH (ref 70–99)
Potassium: 3.5 mmol/L (ref 3.5–5.1)
Sodium: 137 mmol/L (ref 135–145)

## 2019-05-20 MED ORDER — SODIUM CHLORIDE 0.9 % IV BOLUS
500.0000 mL | Freq: Once | INTRAVENOUS | Status: AC
  Administered 2019-05-20: 21:00:00 500 mL via INTRAVENOUS

## 2019-05-20 MED ORDER — KETOROLAC TROMETHAMINE 15 MG/ML IJ SOLN
15.0000 mg | Freq: Once | INTRAMUSCULAR | Status: AC
  Administered 2019-05-20: 21:00:00 15 mg via INTRAVENOUS
  Filled 2019-05-20: qty 1

## 2019-05-20 MED ORDER — BENZONATATE 100 MG PO CAPS: 100.0000 mg | ORAL_CAPSULE | Freq: Three times a day (TID) | ORAL | 0 refills | Status: AC | PRN

## 2019-05-20 NOTE — Discharge Instructions (Addendum)
Recommend Tylenol Motrin as needed for pain and fever control.  If you develop difficulty breathing, chest pain or tightness, or other new concerning symptom, return to ER for reassessment.  Follow isolation precautions per latest CDC guidelines.

## 2019-05-20 NOTE — ED Notes (Signed)
Per patient last tylenol at 1300. Dose of 650 mg.
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
## 2019-05-20 NOTE — ED Triage Notes (Signed)
Patient stated that she could not smell, taste and poor appetite since Friday.  She was at [facility] and was advised to come here.  Temp of 100.8 PTA at UC.

## 2020-05-11 ENCOUNTER — Emergency Department (HOSPITAL_BASED_OUTPATIENT_CLINIC_OR_DEPARTMENT_OTHER): Payer: Medicaid Other

## 2020-05-11 ENCOUNTER — Encounter (HOSPITAL_BASED_OUTPATIENT_CLINIC_OR_DEPARTMENT_OTHER): Payer: Self-pay | Admitting: Emergency Medicine

## 2020-05-11 ENCOUNTER — Emergency Department (HOSPITAL_BASED_OUTPATIENT_CLINIC_OR_DEPARTMENT_OTHER)
Admission: EM | Admit: 2020-05-11 | Discharge: 2020-05-11 | Disposition: A | Discharge status: Home / Self Care | Payer: Medicaid Other | Attending: Emergency Medicine | Admitting: Emergency Medicine

## 2020-05-11 ENCOUNTER — Emergency Department (HOSPITAL_BASED_OUTPATIENT_CLINIC_OR_DEPARTMENT_OTHER): Payer: Medicaid Other | Admitting: Radiology

## 2020-05-11 ENCOUNTER — Other Ambulatory Visit: Payer: Self-pay

## 2020-05-11 DIAGNOSIS — Y92410 Unspecified street and highway as the place of occurrence of the external cause: Secondary | ICD-10-CM | POA: Diagnosis not present

## 2020-05-11 DIAGNOSIS — M62830 Muscle spasm of back: Secondary | ICD-10-CM | POA: Insufficient documentation

## 2020-05-11 DIAGNOSIS — R519 Headache, unspecified: Secondary | ICD-10-CM | POA: Diagnosis not present

## 2020-05-11 DIAGNOSIS — M62838 Other muscle spasm: Secondary | ICD-10-CM

## 2020-05-11 DIAGNOSIS — I10 Essential (primary) hypertension: Secondary | ICD-10-CM | POA: Diagnosis not present

## 2020-05-11 DIAGNOSIS — M546 Pain in thoracic spine: Secondary | ICD-10-CM | POA: Diagnosis present

## 2020-05-11 MED ORDER — KETOROLAC TROMETHAMINE 30 MG/ML IJ SOLN
30.0000 mg | Freq: Once | INTRAMUSCULAR | Status: AC
  Administered 2020-05-11: 30 mg via INTRAMUSCULAR
  Filled 2020-05-11: qty 1

## 2020-05-11 MED ORDER — IBUPROFEN 600 MG PO TABS: 600.0000 mg | ORAL_TABLET | Freq: Four times a day (QID) | ORAL | 0 refills | Status: AC | PRN

## 2020-05-11 MED ORDER — METHOCARBAMOL 500 MG PO TABS: 500.0000 mg | ORAL_TABLET | Freq: Two times a day (BID) | ORAL | 0 refills | Status: AC

## 2020-05-11 MED ORDER — DEXAMETHASONE SODIUM PHOSPHATE 10 MG/ML IJ SOLN
10.0000 mg | Freq: Once | INTRAMUSCULAR | Status: AC
  Administered 2020-05-11: 10 mg via INTRAMUSCULAR
  Filled 2020-05-11: qty 1
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
## 2020-05-11 NOTE — Discharge Instructions (Addendum)
F/u with your pcp to get your bp rechecked.

## 2020-08-04 ENCOUNTER — Emergency Department (HOSPITAL_BASED_OUTPATIENT_CLINIC_OR_DEPARTMENT_OTHER): Payer: Medicaid Other

## 2020-08-04 ENCOUNTER — Other Ambulatory Visit: Payer: Self-pay

## 2020-08-04 ENCOUNTER — Encounter (HOSPITAL_BASED_OUTPATIENT_CLINIC_OR_DEPARTMENT_OTHER): Payer: Self-pay

## 2020-08-04 ENCOUNTER — Emergency Department (HOSPITAL_BASED_OUTPATIENT_CLINIC_OR_DEPARTMENT_OTHER)
Admission: EM | Admit: 2020-08-04 | Discharge: 2020-08-04 | Disposition: A | Discharge status: Home / Self Care | Payer: Medicaid Other | Attending: Emergency Medicine | Admitting: Emergency Medicine

## 2020-08-04 DIAGNOSIS — S92425A Nondisplaced fracture of distal phalanx of left great toe, initial encounter for closed fracture: Secondary | ICD-10-CM

## 2020-08-04 DIAGNOSIS — W2209XA Striking against other stationary object, initial encounter: Secondary | ICD-10-CM | POA: Insufficient documentation

## 2020-08-04 DIAGNOSIS — S99921A Unspecified injury of right foot, initial encounter: Secondary | ICD-10-CM | POA: Diagnosis present

## 2020-08-04 MED ORDER — OXYCODONE HCL 5 MG PO TABS: 5.0000 mg | ORAL_TABLET | ORAL | 0 refills | Status: AC | PRN

## 2020-08-04 MED ORDER — OXYCODONE HCL 5 MG PO TABS: 5.0000 mg | ORAL_TABLET | ORAL | 0 refills | Status: DC | PRN

## 2020-08-04 MED ORDER — NAPROXEN 250 MG PO TABS
500.0000 mg | ORAL_TABLET | Freq: Once | ORAL | Status: AC
  Administered 2020-08-04: 500 mg via ORAL
  Filled 2020-08-04: qty 2

## 2020-08-04 NOTE — Discharge Instructions (Addendum)
You were evaluated in the Emergency Department and after careful evaluation, we did not find any emergent condition requiring admission or further testing in the hospital.  Your exam/testing today was overall reassuring.  X-ray shows a broken toe.  Recommend follow-up with the orthopedic specialist.  Recommend Tylenol 1000 mg every 4-6 hours and/or Motrin 600 mg every 4-6 hours for pain.  You can use the oxycodone medication for more significant pain.  Please return to the Emergency Department if you experience any worsening of your condition.  Thank you for allowing [place] to be a part of your care.
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
## 2020-08-04 NOTE — ED Provider Notes (Signed)
[facility] Emergency Department Provider Note MRN:  [id number]  Arrival date & time: 08/04/20     Chief Complaint   Toe Pain (Left Large)   History of Present Illness   Robin [name] is a 50 y.o. year-old female with no pertinent past medical presenting to the ED with chief complaint of toe pain.  Left great toe pain since 1 hour ago.  Stubbed the toe against the dresser.  Pain is constant, severe, worse with motion or palpation.  Did not fall, no other trauma, no other complaints.  Review of Systems  A problem-focused ROS was performed. Positive for toe pain.  Patient denies other injuries.  Patient's Health History    Past Medical History:  Diagnosis Date   Vertigo     Past Surgical History:  Procedure Laterality Date   cesarian     x2    Family History  Problem Relation Age of Onset   Hypertension Other    Diabetes Other     Social History   Socioeconomic History   Marital status: Single    Spouse name: Not on file   Number of children: Not on file   Years of education: Not on file   Highest education level: Not on file  Occupational History   Not on file  Tobacco Use   Smoking status: Never   Smokeless tobacco: Never  Vaping Use   Vaping Use: Never used  Substance and Sexual Activity   Alcohol use: No    Comment: Usually drinks once a month   Drug use: No   Sexual activity: Not on file  Other Topics Concern   Not on file  Social History Narrative   Not on file   Social Determinants of Health   Financial Resource Strain: Not on file  Food Insecurity: Not on file  Transportation Needs: Not on file  Physical Activity: Not on file  Stress: Not on file  Social Connections: Not on file  Intimate Partner Violence: Not on file     Physical Exam   Vitals:   08/04/20 0627  BP: (!) 178/98  Pulse: 83  Resp: 16  Temp: 98.3 F (36.8 C)  SpO2: 100%    CONSTITUTIONAL: Well-appearing, NAD NEURO:  Alert and oriented x 3,  no focal deficits EYES:  eyes equal and reactive ENT/NECK:  no LAD, no JVD CARDIO: Regular rate, well-perfused, normal S1 and S2 PULM:  CTAB no wheezing or rhonchi GI/GU:  normal bowel sounds, non-distended, non-tender MSK/SPINE:  No gross deformities, no edema SKIN:  no rash, atraumatic PSYCH:  Appropriate speech and behavior  *Additional and/or pertinent findings included in MDM below  Diagnostic and Interventional Summary    EKG Interpretation  Date/Time:    Ventricular Rate:    PR Interval:    QRS Duration:   QT Interval:    QTC Calculation:   R Axis:     Text Interpretation:         Labs Reviewed - No data to display  DG Foot Complete Left    (Results Pending)    Medications - No data to display   Procedures  /  Critical Care Procedures  ED Course and Medical Decision Making  I have reviewed the triage vital signs, the nursing notes, and pertinent available records from the EMR.  Listed above are laboratory and imaging tests that I personally ordered, reviewed, and interpreted and then considered in my medical decision making (see below for details).  Toe  appears normal, neurovascularly intact distally, but is tender to palpation at the MTP x-ray to exclude fracture.     X-ray appears to have a distal phalangeal fracture.  Placing in postop shoe, buddy tape.  Will refer to orthopedics.  Appropriate for discharge.  Elmer Sow. Pilar Plate, MD [facility] Emergency Medicine [facility] [email or web address]  Final Clinical Impressions(s) / ED Diagnoses     ICD-10-CM   1. Closed nondisplaced fracture of distal phalanx of left great toe, initial encounter  S92.425A       ED Discharge Orders          Ordered    oxyCODONE (ROXICODONE) 5 MG immediate release tablet  Every 4 hours PRN        08/04/20 0645             Discharge Instructions Discussed with and Provided to Patient:    Discharge Instructions      You were evaluated in the  Emergency Department and after careful evaluation, we did not find any emergent condition requiring admission or further testing in the hospital.  Your exam/testing today was overall reassuring.  X-ray shows a broken toe.  Recommend follow-up with the orthopedic specialist.  Recommend Tylenol 1000 mg every 4-6 hours and/or Motrin 600 mg every 4-6 hours for pain.  You can use the oxycodone medication for more significant pain.  Please return to the Emergency Department if you experience any worsening of your condition.  Thank you for allowing [place] to be a part of your care.        Sabas Sous, MD 08/04/20 [phone]

## 2020-08-05 ENCOUNTER — Ambulatory Visit: Payer: Medicaid Other | Admitting: Family

## 2020-08-05 ENCOUNTER — Encounter: Payer: Self-pay | Admitting: Family

## 2020-08-05 DIAGNOSIS — S92425A Nondisplaced fracture of distal phalanx of left great toe, initial encounter for closed fracture: Secondary | ICD-10-CM

## 2020-08-05 NOTE — Progress Notes (Signed)
Office Visit Note   Patient: Robin [name]           Date of Birth: Jun 01, 1970           MRN: [id number] Visit Date: 08/05/2020              Requested by: No referring provider defined for this encounter. PCP: Patient, No Pcp Per (Inactive)  Chief Complaint  Patient presents with   Left Foot - Injury    DOI7/26/22 left great toe      HPI: The patient is a 50 year old woman who presents today for initial evaluation of injury to her left great toe she stubbed her toe on 08/03/20. Was seen and evaluated in ED for same. Radiographs showed intraarticular distal phalanx fracture.   Presents today in a postop shoe with her toes buddy taped. Continues with pain issues.  Assessment & Plan: Visit Diagnoses:  1. Nondisplaced fracture of distal phalanx of left great toe, initial encounter for closed fracture     Plan: continue post op shoe. Discussed natural course of healing. Will follow up in 2 weeks.   Follow-Up Instructions: Return in about 2 weeks (around 08/19/2020).   Ortho Exam  Patient is alert, oriented, no adenopathy, well-dressed, normal affect, normal respiratory effort. On examination of left foot. Foot is plantigrade. No deformity. Mild edema and ecchymosis to great toe. Nail intact.   Imaging: No results found. No images are attached to the encounter.  Labs: No results found for: HGBA1C, ESRSEDRATE, CRP, LABURIC, REPTSTATUS, GRAMSTAIN, CULT, LABORGA   No results found for: ALBUMIN, PREALBUMIN, CBC  No results found for: MG No results found for: VD25OH  No results found for: PREALBUMIN CBC EXTENDED Latest Ref Rng & Units 05/20/2019 03/11/2014 08/09/2013  WBC 4.0 - 10.5 K/uL 4.2 8.2 5.6  RBC 3.87 - 5.11 MIL/uL 4.73 4.15 4.52  HGB 12.0 - 15.0 g/dL 15.6(H) 13.4 15.1(H)  HCT 36.0 - 46.0 % 45.0 40.7 43.5  PLT 150 - 400 K/uL 171 216 218  NEUTROABS 1.7 - 7.7 K/uL 2.4 6.5 3.7  LYMPHSABS 0.7 - 4.0 K/uL 1.2 0.9 1.3     There is no height or weight on file to  calculate BMI.  Orders:  No orders of the defined types were placed in this encounter.  No orders of the defined types were placed in this encounter.    Procedures: No procedures performed  Clinical Data: No additional findings.  ROS:  All other systems negative, except as noted in the HPI. Review of Systems  Constitutional:  Negative for chills and fever.  Cardiovascular:  Negative for leg swelling.  Musculoskeletal:  Positive for arthralgias.  Skin:  Positive for color change.   Objective: Vital Signs: There were no vitals taken for this visit.  Specialty Comments:  No specialty comments available.  PMFS History: There are no problems to display for this patient.  Past Medical History:  Diagnosis Date   Vertigo     Family History  Problem Relation Age of Onset   Hypertension Other    Diabetes Other     Past Surgical History:  Procedure Laterality Date   cesarian     x2   Social History   Occupational History   Not on file  Tobacco Use   Smoking status: Never   Smokeless tobacco: Never  Vaping Use   Vaping Use: Never used  Substance and Sexual Activity   Alcohol use: No    Comment: Usually drinks once a month  Drug use: No   Sexual activity: Not on file

## 2020-08-26 ENCOUNTER — Ambulatory Visit: Payer: Medicaid Other | Admitting: Family

## 2020-09-10 ENCOUNTER — Ambulatory Visit (INDEPENDENT_AMBULATORY_CARE_PROVIDER_SITE_OTHER): Payer: Medicaid Other

## 2020-09-10 ENCOUNTER — Ambulatory Visit: Payer: Medicaid Other | Admitting: Orthopedic Surgery

## 2020-09-10 DIAGNOSIS — M79675 Pain in left toe(s): Secondary | ICD-10-CM

## 2020-09-15 ENCOUNTER — Encounter: Payer: Self-pay | Admitting: Orthopedic Surgery

## 2020-09-15 NOTE — Progress Notes (Signed)
   Office Visit Note   Patient: Robin [name]           Date of Birth: [birth year]           MRN: [id number] Visit Date: 09/10/2020              Requested by: No referring provider defined for this encounter. PCP: Patient, No Pcp Per (Inactive)  Chief Complaint  Patient presents with   Other     Left great toe pain      HPI: Patient is a 50 year old woman who presents with left great toe pain status post fracture.  Patient states she has decreased range of motion due to pain  Assessment & Plan: Visit Diagnoses:  1. Toe pain, left     Plan: Recommended a stiff soled shoe we will place her in a postoperative shoe to unload the great toe she will work on range of motion.  Follow-Up Instructions: Return if symptoms worsen or fail to improve.   Ortho Exam  Patient is alert, oriented, no adenopathy, well-dressed, normal affect, normal respiratory effort. Examination she does have swelling of the left great toe there is no cellulitis no paronychial infection no ecchymosis or bruising.  Imaging: No results found. No images are attached to the encounter.  Labs: No results found for: HGBA1C, ESRSEDRATE, CRP, LABURIC, REPTSTATUS, GRAMSTAIN, CULT, LABORGA   No results found for: ALBUMIN, PREALBUMIN, CBC  No results found for: MG No results found for: VD25OH  No results found for: PREALBUMIN CBC EXTENDED Latest Ref Rng & Units 05/20/2019 03/11/2014 08/09/2013  WBC 4.0 - 10.5 K/uL 4.2 8.2 5.6  RBC 3.87 - 5.11 MIL/uL 4.73 4.15 4.52  HGB 12.0 - 15.0 g/dL 15.6(H) 13.4 15.1(H)  HCT 36.0 - 46.0 % 45.0 40.7 43.5  PLT 150 - 400 K/uL 171 216 218  NEUTROABS 1.7 - 7.7 K/uL 2.4 6.5 3.7  LYMPHSABS 0.7 - 4.0 K/uL 1.2 0.9 1.3     There is no height or weight on file to calculate BMI.  Orders:  Orders Placed This Encounter  Procedures   XR Toe Great Left   No orders of the defined types were placed in this encounter.    Procedures: No procedures performed  Clinical Data: No  additional findings.  ROS:  All other systems negative, except as noted in the HPI. Review of Systems  Objective: Vital Signs: There were no vitals taken for this visit.  Specialty Comments:  No specialty comments available.  PMFS History: There are no problems to display for this patient.  Past Medical History:  Diagnosis Date   Vertigo     Family History  Problem Relation Age of Onset   Hypertension Other    Diabetes Other     Past Surgical History:  Procedure Laterality Date   cesarian     x2   Social History   Occupational History   Not on file  Tobacco Use   Smoking status: Never   Smokeless tobacco: Never  Vaping Use   Vaping Use: Never used  Substance and Sexual Activity   Alcohol use: No    Comment: Usually drinks once a month   Drug use: No   Sexual activity: Not on file

## 2022-04-05 ENCOUNTER — Emergency Department (HOSPITAL_COMMUNITY): Payer: Medicaid Other

## 2022-04-05 ENCOUNTER — Emergency Department (HOSPITAL_COMMUNITY)
Admission: EM | Admit: 2022-04-05 | Discharge: 2022-04-05 | Disposition: A | Discharge status: Home / Self Care | Payer: Medicaid Other | Attending: Emergency Medicine | Admitting: Emergency Medicine

## 2022-04-05 ENCOUNTER — Encounter (HOSPITAL_COMMUNITY): Payer: Self-pay

## 2022-04-05 DIAGNOSIS — I160 Hypertensive urgency: Secondary | ICD-10-CM | POA: Insufficient documentation

## 2022-04-05 DIAGNOSIS — R42 Dizziness and giddiness: Secondary | ICD-10-CM | POA: Diagnosis present

## 2022-04-05 DIAGNOSIS — I10 Essential (primary) hypertension: Secondary | ICD-10-CM | POA: Insufficient documentation

## 2022-04-05 LAB — BASIC METABOLIC PANEL
Anion gap: 9 (ref 5–15)
BUN: 12 mg/dL (ref 6–20)
CO2: 23 mmol/L (ref 22–32)
Calcium: 8.8 mg/dL — ABNORMAL LOW (ref 8.9–10.3)
Chloride: 104 mmol/L (ref 98–111)
Creatinine, Ser: 1.06 mg/dL — ABNORMAL HIGH (ref 0.44–1.00)
GFR, Estimated: >60 mL/min (ref >60)
Glucose, Bld: 127 mg/dL — ABNORMAL HIGH (ref 70–99)
Potassium: 4.3 mmol/L (ref 3.5–5.1)
Sodium: 136 mmol/L (ref 135–145)

## 2022-04-05 LAB — CBC WITH DIFFERENTIAL/PLATELET
Abs Immature Granulocytes: 0.01 10*3/uL (ref 0.00–0.07)
Basophils Absolute: 0 10*3/uL (ref 0.0–0.1)
Basophils Relative: 0 %
Eosinophils Absolute: 0 10*3/uL (ref 0.0–0.5)
Eosinophils Relative: 1 %
HCT: 45.8 % (ref 36.0–46.0)
Hemoglobin: 15.1 g/dL — ABNORMAL HIGH (ref 12.0–15.0)
Immature Granulocytes: 0 %
Lymphocytes Relative: 39 %
Lymphs Abs: 1.7 10*3/uL (ref 0.7–4.0)
MCH: 31.9 pg (ref 26.0–34.0)
MCHC: 33 g/dL (ref 30.0–36.0)
MCV: 96.6 fL (ref 80.0–100.0)
Monocytes Absolute: 0.4 10*3/uL (ref 0.1–1.0)
Monocytes Relative: 10 %
Neutro Abs: 2.2 10*3/uL (ref 1.7–7.7)
Neutrophils Relative %: 50 %
Platelets: 269 10*3/uL (ref 150–400)
RBC: 4.74 MIL/uL (ref 3.87–5.11)
RDW: 11.6 % (ref 11.5–15.5)
WBC: 4.3 10*3/uL (ref 4.0–10.5)
nRBC: 0 % (ref 0.0–0.2)

## 2022-04-05 LAB — TROPONIN I (HIGH SENSITIVITY): Troponin I (High Sensitivity): 6 ng/L (ref <18)

## 2022-04-05 MED ORDER — AMLODIPINE BESYLATE 5 MG PO TABS: 5.0000 mg | ORAL_TABLET | Freq: Every day | ORAL | 0 refills | Status: DC

## 2022-04-05 MED ORDER — CLONIDINE HCL 0.1 MG PO TABS
0.1000 mg | ORAL_TABLET | Freq: Once | ORAL | Status: AC
  Administered 2022-04-05: 0.1 mg via ORAL
  Filled 2022-04-05: qty 1

## 2022-04-05 MED ORDER — AMLODIPINE BESYLATE 5 MG PO TABS
5.0000 mg | ORAL_TABLET | Freq: Once | ORAL | Status: AC
  Administered 2022-04-05: 5 mg via ORAL
  Filled 2022-04-05: qty 1

## 2022-04-05 NOTE — ED Provider Notes (Signed)
[facility] Provider Note   CSN: [id number] Arrival date & time: 04/05/22  1302     History No chief complaint on file.   HPI Robin [name] is a 52 y.o. female presenting for chief complaint of elevated blood pressure.  She states that starting 36 hours ago she started having episodic dizziness that she describes as spinning.  States she has a history of vertigo and felt similar.  However her current episode was more severe than normal.  She states that it is not currently present.  She does have some minor ongoing headache.  She presented to urgent care for evaluation today who identified a blood pressure of 220/180 and sent her to the emergency department for further care and management.  With EMS, patient's blood pressure remained elevated.  She denies fevers chills nausea vomiting syncope or shortness of breath.  Otherwise ambulatory tolerating p.o. intake.  No known sick contacts..   Patient's recorded medical, surgical, social, medication list and allergies were reviewed in the Snapshot window as part of the initial history.   Review of Systems   Review of Systems  Constitutional:  Negative for chills and fever.  HENT:  Negative for ear pain and sore throat.   Eyes:  Negative for pain and visual disturbance.  Respiratory:  Negative for cough and shortness of breath.   Cardiovascular:  Negative for chest pain and palpitations.  Gastrointestinal:  Negative for abdominal pain and vomiting.  Genitourinary:  Negative for dysuria and hematuria.  Musculoskeletal:  Negative for arthralgias and back pain.  Skin:  Negative for color change and rash.  Neurological:  Positive for dizziness and light-headedness. Negative for seizures and syncope.  All other systems reviewed and are negative.   Physical Exam Updated Vital Signs BP (!) 171/68   Pulse 72   Temp 98.3 F (36.8 C) (Oral)   Resp 17   Ht 4\' 11"  (1.499 m)   Wt 79.4 kg   SpO2  100%   BMI 35.35 kg/m  Physical Exam Vitals and nursing note reviewed.  Constitutional:      General: She is not in acute distress.    Appearance: She is well-developed.  HENT:     Head: Normocephalic and atraumatic.  Eyes:     Conjunctiva/sclera: Conjunctivae normal.  Cardiovascular:     Rate and Rhythm: Normal rate and regular rhythm.     Heart sounds: No murmur heard. Pulmonary:     Effort: Pulmonary effort is normal. No respiratory distress.     Breath sounds: Normal breath sounds.  Abdominal:     General: There is no distension.     Palpations: Abdomen is soft.     Tenderness: There is no abdominal tenderness. There is no right CVA tenderness or left CVA tenderness.  Musculoskeletal:        General: No swelling or tenderness. Normal range of motion.     Cervical back: Neck supple.  Skin:    General: Skin is warm and dry.  Neurological:     General: No focal deficit present.     Mental Status: She is alert and oriented to person, place, and time. Mental status is at baseline.     Cranial Nerves: No cranial nerve deficit.      ED Course/ Medical Decision Making/ A&P    Procedures .Critical Care  Performed by: Tretha Sciara, MD Authorized by: Tretha Sciara, MD   Critical care provider statement:    Critical care  time (minutes):  30   Critical care was necessary to treat or prevent imminent or life-threatening deterioration of the following conditions:  Circulatory failure (Hypertensive emergency with diastolic above [id number] requiring immediate treatment in emergency room.)   Critical care was time spent personally by me on the following activities:  Development of treatment plan with patient or surrogate, discussions with consultants, evaluation of patient's response to treatment, examination of patient, ordering and review of laboratory studies, ordering and review of radiographic studies, ordering and performing treatments and interventions, pulse oximetry,  re-evaluation of patient's condition and review of old charts    Medications Ordered in ED Medications  amLODipine (NORVASC) tablet 5 mg (has no administration in time range)  cloNIDine (CATAPRES) tablet 0.1 mg (0.1 mg Oral Given 04/05/22 1351)   Medical Decision Making:   [name] is a 52 y.o. female who presented to the ED today with multiple symptoms detailed above.    Handoff received from EMS.  Additional history discussed with patient's family/caregivers.  Patient placed on continuous vitals and telemetry monitoring while in ED which was reviewed periodically.  Complete initial physical exam performed, notably the patient  was grossly hypertensive in the 220s on arrival overall otherwise asymptomatic no acute distress.    Reviewed and confirmed nursing documentation for past medical history, family history, social history.    Initial Assessment:   With the patient's presentation of elevated blood pressure readings, most likely diagnosis is hypertensive urgency. Other diagnoses associated with hypertensive emergency were considered including (but not limited to) intracranial hemorrhage, acute renal artery stenosis, acute kidney injury, myocardial stress, ophthalmologic emergencies. These are considered less likely due to history of present illness and physical exam findings.   This is most consistent with an acute life/limb threatening illness complicated by underlying chronic conditions. Will evaluate for hypertensive emergency as below. Initial Plan:  Screening labs including CBC and Metabolic panel to evaluate for infectious or metabolic etiology of disease.  CXR to evaluate for structural/infectious intrathoracic pathology.  Given headache, eval for ICH with CTH Troponin/EKG to evaluate for cardiac pathology. Objective evaluation as below reviewed.  Given severe nature of patient's hypertension with elevations of diastolic blood pressure above 130, will treat with p.o.  clonidine.  Initial Study Results:   Laboratory  All laboratory results reviewed without evidence of clinically relevant pathology.   EKG EKG was reviewed independently. Rate, rhythm, axis, intervals all examined and without medically relevant abnormality. ST segments without concerns for elevations.    Radiology:  All images reviewed independently. Agree with radiology report at this time.   CT HEAD WO CONTRAST (5MM)  Result Date: 04/05/2022 CLINICAL DATA:  Vertigo, headache EXAM: CT HEAD WITHOUT CONTRAST TECHNIQUE: Contiguous axial images were obtained from the base of the skull through the vertex without intravenous contrast. RADIATION DOSE REDUCTION: This exam was performed according to the departmental dose-optimization program which includes automated exposure control, adjustment of the mA and/or kV according to patient size and/or use of iterative reconstruction technique. COMPARISON:  05/11/2020 FINDINGS: Brain: No evidence of acute infarction, hemorrhage, mass, mass effect, or midline shift. No hydrocephalus or extra-axial fluid collection. Mineralization in the left basal ganglia. Partial empty sella. Vascular: No hyperdense vessel. Skull: Negative for fracture or focal lesion. Sinuses/Orbits: Clear paranasal sinuses. No acute finding in the orbits. Other: The mastoid air cells are well aerated. IMPRESSION: No acute intracranial process. Electronically Signed   By: Merilyn Baba M.D.   On: 04/05/2022 14:29     Final  Assessment and Plan:   Patient's blood pressure improved after initiation of clonidine.  Hypertensin still residual but approximately 20% reduction from 2 [id number] systolic. I recommended continuation of clonidine 3 times daily for 14 days and follow-up with PCP in the interim.  Patient states that she will not be able to continue this medicine 3 times a day and is requesting simpler medication regimen.  She feels comfortable with amlodipine once daily.  I expressed the risk of  hypertensive urgency this evening secondary to slow onset of amlodipine and patient expressed understanding will start medicine right now.  First dose given in emergency room, patient tolerating in no acute distress.  Given resolution of symptoms overall well appearance, patient stable for outpatient care management without indication for further intervention at this time.  Disposition:  I have considered need for hospitalization, however, considering all of the above, I believe this patient is stable for discharge at this time.  Patient/family educated about specific return precautions for given chief complaint and symptoms.  Patient/family educated about follow-up with PCP.     Patient/family expressed understanding of return precautions and need for follow-up. Patient spoken to regarding all imaging and laboratory results and appropriate follow up for these results. All education provided in verbal form with additional information in written form. Time was allowed for answering of patient questions. Patient discharged.    Emergency Department Medication Summary:   Medications  amLODipine (NORVASC) tablet 5 mg (has no administration in time range)  cloNIDine (CATAPRES) tablet 0.1 mg (0.1 mg Oral Given 04/05/22 1351)       Clinical Impression:  1. Hypertensive urgency      Discharge   Final Clinical Impression(s) / ED Diagnoses Final diagnoses:  Hypertensive urgency    Rx / DC Orders ED Discharge Orders          Ordered    amLODipine (NORVASC) 5 MG tablet  Daily        04/05/22 1505              Tretha Sciara, MD 04/05/22 [phone]

## 2022-04-05 NOTE — ED Triage Notes (Addendum)
Pt bib ems from urgent care; hx vertigo; started feeling dizzy on Sunday along with HA , nausea; took dramamine last night; continues to have symptoms ; hypertensive 220/104, no diagnosed hx; denies cp, endorses some sob, worse with exertion; no nero deficits; some ST depression on 12 lead with ems; 20 ga LH; 4 mg Zofran given PTA

## 2022-04-07 DIAGNOSIS — R0683 Snoring: Secondary | ICD-10-CM | POA: Insufficient documentation

## 2022-04-09 ENCOUNTER — Emergency Department (HOSPITAL_COMMUNITY)
Admission: EM | Admit: 2022-04-09 | Discharge: 2022-04-10 | Disposition: A | Discharge status: Home / Self Care | Payer: Medicaid Other | Attending: Emergency Medicine | Admitting: Emergency Medicine

## 2022-04-09 ENCOUNTER — Emergency Department (HOSPITAL_COMMUNITY): Payer: Medicaid Other

## 2022-04-09 ENCOUNTER — Encounter (HOSPITAL_COMMUNITY): Payer: Self-pay | Admitting: Emergency Medicine

## 2022-04-09 DIAGNOSIS — R0789 Other chest pain: Secondary | ICD-10-CM | POA: Diagnosis present

## 2022-04-09 DIAGNOSIS — K760 Fatty (change of) liver, not elsewhere classified: Secondary | ICD-10-CM | POA: Diagnosis not present

## 2022-04-09 DIAGNOSIS — Z79899 Other long term (current) drug therapy: Secondary | ICD-10-CM | POA: Insufficient documentation

## 2022-04-09 DIAGNOSIS — I10 Essential (primary) hypertension: Secondary | ICD-10-CM | POA: Diagnosis not present

## 2022-04-09 DIAGNOSIS — M546 Pain in thoracic spine: Secondary | ICD-10-CM | POA: Diagnosis not present

## 2022-04-09 LAB — CBC
HCT: 39.3 % (ref 36.0–46.0)
Hemoglobin: 13.7 g/dL (ref 12.0–15.0)
MCH: 32.9 pg (ref 26.0–34.0)
MCHC: 34.9 g/dL (ref 30.0–36.0)
MCV: 94.5 fL (ref 80.0–100.0)
Platelets: 252 10*3/uL (ref 150–400)
RBC: 4.16 MIL/uL (ref 3.87–5.11)
RDW: 11.7 % (ref 11.5–15.5)
WBC: 5.1 10*3/uL (ref 4.0–10.5)
nRBC: 0 % (ref 0.0–0.2)

## 2022-04-09 LAB — I-STAT BETA HCG BLOOD, ED (MC, WL, AP ONLY): I-stat hCG, quantitative: <5 m[IU]/mL (ref <5)

## 2022-04-09 LAB — TROPONIN I (HIGH SENSITIVITY): Troponin I (High Sensitivity): 15 ng/L (ref <18)

## 2022-04-09 LAB — BASIC METABOLIC PANEL
Anion gap: 10 (ref 5–15)
BUN: 14 mg/dL (ref 6–20)
CO2: 24 mmol/L (ref 22–32)
Calcium: 8.9 mg/dL (ref 8.9–10.3)
Chloride: 104 mmol/L (ref 98–111)
Creatinine, Ser: 1 mg/dL (ref 0.44–1.00)
GFR, Estimated: >60 mL/min (ref >60)
Glucose, Bld: 134 mg/dL — ABNORMAL HIGH (ref 70–99)
Potassium: 3.8 mmol/L (ref 3.5–5.1)
Sodium: 138 mmol/L (ref 135–145)

## 2022-04-09 MED ORDER — AMLODIPINE BESYLATE 5 MG PO TABS
10.0000 mg | ORAL_TABLET | Freq: Once | ORAL | Status: AC
  Administered 2022-04-09: 10 mg via ORAL
  Filled 2022-04-09: qty 2

## 2022-04-09 NOTE — ED Triage Notes (Signed)
Pt seen here recently for chest pain and work up clear. She is back due to chest pain and hypertension starting this afternoon. No acute distress.

## 2022-04-10 ENCOUNTER — Emergency Department (HOSPITAL_COMMUNITY): Payer: Medicaid Other

## 2022-04-10 LAB — TROPONIN I (HIGH SENSITIVITY): Troponin I (High Sensitivity): 14 ng/L (ref <18)

## 2022-04-10 MED ORDER — IOHEXOL 350 MG/ML SOLN
100.0000 mL | Freq: Once | INTRAVENOUS | Status: AC | PRN
  Administered 2022-04-10: 100 mL via INTRAVENOUS

## 2022-04-10 MED ORDER — CYCLOBENZAPRINE HCL 10 MG PO TABS: 10.0000 mg | ORAL_TABLET | Freq: Two times a day (BID) | ORAL | 0 refills | Status: AC | PRN

## 2022-04-10 MED ORDER — METHOCARBAMOL 500 MG PO TABS
1000.0000 mg | ORAL_TABLET | Freq: Once | ORAL | Status: AC
  Administered 2022-04-10: 1000 mg via ORAL
  Filled 2022-04-10: qty 2

## 2022-04-10 NOTE — Discharge Instructions (Signed)
Blood pressure-it was slightly elevated today, I would like you to increase your amlodipine from 5 to 10 mg daily, please keep track of your blood pressure, if you note that your blood pressure is getting too low i.e. top numbers below 100, or you start to feel lightheaded dizziness, feel you are going to pass out please go back to taking only 5 mg daily.  follow-up with your primary care doctor. Left-sided back pain-suspect this is muscular, you may use over-the-counter pain medication as needed, I have given you a muscle relaxer please take as prescribed this medication make you drowsy do not consume alcohol or operate heavy machinery while take this medication.  Come back to the emergency department if you develop chest pain, shortness of breath, severe abdominal pain, uncontrolled nausea, vomiting, diarrhea.
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
## 2022-05-02 DIAGNOSIS — R87619 Unspecified abnormal cytological findings in specimens from cervix uteri: Secondary | ICD-10-CM | POA: Insufficient documentation
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
## 2022-05-02 NOTE — Patient Instructions (Signed)
Sleep apnea is defined as period of 10 seconds or longer when you stop breathing at night. This can happen multiple times a night. Dx sleep apnea is when this occurs more than 5 times an hour.    Mild OSA 5-15 apneic events an hour Moderate OSA 15-30 apneic events an hour Severe OSA > 30 apneic events an hour   Untreated sleep apnea puts you at higher risk for cardiac arrhythmias, pulmonary HTN, stroke and diabetes  Treatment options include weight loss, side sleeping position, oral appliance, CPAP therapy or referral to ENT for possible surgical options    Recommendations: Focus on side sleeping position or elevate head with wedge pillow 30 degrees Work on weight loss efforts if able  Do not drive if experiencing excessive daytime sleepiness of fatigue    Orders: Home sleep study re: loud snoring (ordered)   Follow-up: Please call to schedule follow-up 1-2 weeks after completing home sleep study to review results and treatment if needed (can be virtual)   Sleep Apnea Sleep apnea affects breathing during sleep. It causes breathing to stop for 10 seconds or more, or to become shallow. People with sleep apnea usually snore loudly. It can also increase the risk of: Heart attack. Stroke. Being very overweight (obese). Diabetes. Heart failure. Irregular heartbeat. High blood pressure. The goal of treatment is to help you breathe normally again. What are the causes?  The most common cause of this condition is a collapsed or blocked airway. There are three kinds of sleep apnea: Obstructive sleep apnea. This is caused by a blocked or collapsed airway. Central sleep apnea. This happens when the brain does not send the right signals to the muscles that control breathing. Mixed sleep apnea. This is a combination of obstructive and central sleep apnea. What increases the risk? Being overweight. Smoking. Having a small airway. Being older. Being female. Drinking alcohol. Taking  medicines to calm yourself (sedatives or tranquilizers). Having family members with the condition. Having a tongue or tonsils that are larger than normal. What are the signs or symptoms? Trouble staying asleep. Loud snoring. Headaches in the morning. Waking up gasping. Dry mouth or sore throat in the morning. Being sleepy or tired during the day. If you are sleepy or tired during the day, you may also: Not be able to focus your mind (concentrate). Forget things. Get angry a lot and have mood swings. Feel sad (depressed). Have changes in your personality. Have less interest in sex, if you are female. Be unable to have an erection, if you are female. How is this treated?  Sleeping on your side. Using a medicine to get rid of mucus in your nose (decongestant). Avoiding the use of alcohol, medicines to help you relax, or certain pain medicines (narcotics). Losing weight, if needed. Changing your diet. Quitting smoking. Using a machine to open your airway while you sleep, such as: An oral appliance. This is a mouthpiece that shifts your lower jaw forward. A CPAP device. This device blows air through a mask when you breathe out (exhale). An EPAP device. This has valves that you put in each nostril. A BIPAP device. This device blows air through a mask when you breathe in (inhale) and breathe out. Having surgery if other treatments do not work. Follow these instructions at home: Lifestyle Make changes that your doctor recommends. Eat a healthy diet. Lose weight if needed. Avoid alcohol, medicines to help you relax, and some pain medicines. Do not smoke or use any products that   contain nicotine or tobacco. If you need help quitting, ask your doctor. General instructions Take over-the-counter and prescription medicines only as told by your doctor. If you were given a machine to use while you sleep, use it only as told by your doctor. If you are having surgery, make sure to tell your  doctor you have sleep apnea. You may need to bring your device with you. Keep all follow-up visits. Contact a doctor if: The machine that you were given to use during sleep bothers you or does not seem to be working. You do not get better. You get worse. Get help right away if: Your chest hurts. You have trouble breathing in enough air. You have an uncomfortable feeling in your back, arms, or stomach. You have trouble talking. One side of your body feels weak. A part of your face is hanging down. These symptoms may be an emergency. Get help right away. Call your local emergency services (911 in the [place] Do not wait to see if the symptoms will go away. Do not drive yourself to the hospital. Summary This condition affects breathing during sleep. The most common cause is a collapsed or blocked airway. The goal of treatment is to help you breathe normally while you sleep. This information is not intended to replace advice given to you by your health care provider. Make sure you discuss any questions you have with your health care provider. Document Revised: 08/05/2020 Document Reviewed: 12/06/2019 Elsevier Patient Education  2023 [employer] Inc[employer]   

## 2022-05-03 ENCOUNTER — Encounter: Payer: Self-pay | Admitting: Primary Care

## 2022-05-03 ENCOUNTER — Ambulatory Visit (INDEPENDENT_AMBULATORY_CARE_PROVIDER_SITE_OTHER): Payer: Medicaid Other | Admitting: Primary Care

## 2022-05-03 VITALS — BP 134/78 | HR 84 | Temp 98.4°F | Ht 59.0 in | Wt 192.6 lb

## 2022-05-03 DIAGNOSIS — R0683 Snoring: Secondary | ICD-10-CM | POA: Diagnosis not present

## 2022-05-03 NOTE — Assessment & Plan Note (Addendum)
-   Patient has symptoms of loud snoring and restless sleep. No significant daytime fatigue. Epworth score 4.  BMI 38.  Concern patient has underlying obstructive sleep apnea, needs home sleep study to evaluate.  Reviewed risks of untreated sleep apnea including cardiac arrhythmias, pulmonary hypertension, diabetes and stroke.  We also discussed treatment options including weight loss, oral appliance, CPAP therapy referral to ENT for possible surgical options.  Encourage patient to focus on side sleeping position.  Avoid alcohol use or sedatives prior to bedtime as these can worsen sleep apnea.  Advised against driving if experiencing excessive daytime sleepiness or fatigue.  FU 1-2 weeks after sleep study to review results and treatment options if needed.

## 2022-05-03 NOTE — Progress Notes (Signed)
Reviewed and agree with assessment/plan.   Coralyn Helling, MD [facility] Pulmonary/Critical Care 05/03/2022, 2:03 PM Pager:  [phone]

## 2022-05-18 ENCOUNTER — Ambulatory Visit: Payer: Medicaid Other | Admitting: Primary Care

## 2022-05-18 DIAGNOSIS — G4733 Obstructive sleep apnea (adult) (pediatric): Secondary | ICD-10-CM

## 2022-05-18 DIAGNOSIS — R0683 Snoring: Secondary | ICD-10-CM

## 2022-05-23 DIAGNOSIS — G4733 Obstructive sleep apnea (adult) (pediatric): Secondary | ICD-10-CM

## 2022-05-27 NOTE — Progress Notes (Signed)
Please let patient know HST on 05/19/22 showed moderate OSA>> AHI 22/hour.   Needs OV to discuss results and treatment options (can be virtual).

## 2022-06-17 ENCOUNTER — Ambulatory Visit (INDEPENDENT_AMBULATORY_CARE_PROVIDER_SITE_OTHER): Payer: Medicaid Other | Admitting: Primary Care

## 2022-06-17 ENCOUNTER — Encounter: Payer: Self-pay | Admitting: Primary Care

## 2022-06-17 VITALS — BP 110/70 | HR 74 | Temp 97.4°F | Ht 59.0 in | Wt 196.4 lb

## 2022-06-17 DIAGNOSIS — G4733 Obstructive sleep apnea (adult) (pediatric): Secondary | ICD-10-CM | POA: Diagnosis not present

## 2022-06-17 DIAGNOSIS — G4730 Sleep apnea, unspecified: Secondary | ICD-10-CM

## 2022-06-17 NOTE — Assessment & Plan Note (Signed)
-   Patient has symptoms of snoring and restless sleep.  Home sleep study 05/19/22 >> AHI 22.2/hour with SpO2 low 72% (average 93%). Reviewed treatment options, she is interested in pursuing oral device for treatments of OSA. We placed an order for referral to Dr. Myrtis Ser, DDS.  Advised patient to notify office if she is not candidate for oral appliance and we will either refer her to ENT for airway exam for possible surgical options or start patient on auto CPAP.  Encourage weight loss and side sleeping position. Cautioned against alcohol use prior to bedtime as this can worsen sleep apnea.  Follow-up in 3 to 4 months or sooner if needed.

## 2022-06-17 NOTE — Progress Notes (Signed)
@Patient  ID: Robin [name], female    DOB: November 21, 1970, 52 y.o.   MRN: [id number]  Chief Complaint  Patient presents with   Follow-up    Referring provider: No ref. provider found  HPI: 52 year old female, never smoked.  Past medical history significant for hypertension, snoring, obesity.  Previous [facility] pulmonary encounter: 05/03/2022 Patient presents today for sleep consult. She has snoring symptoms and hot flashes. Sleep can be restless. She has woken up gasping/choking before while sleeping, usually if she eats late. She does not feel tired during the day. Typical bedtime is 10:30pm. Takes her 20-30 minutes to fall asleep.She wakes up 2-3 times a night. She starts her day at 8:30am. 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 No previous sleep study. No concern for narcolepsy, cataplexy or sleep walking.   Sleep questionnaire Symptoms-  snoring, restless sleep  Prior sleep study- none  Bedtime- 10:30pm  Time to fall asleep- 20-30 min Nocturnal awakenings- 2-3 times  Out of bed/start of day- 8:30am Weight changes- up Do you operate heavy machinery- no Do you currently wear CPAP- no Do you current wear oxygen- no Epworth- 4  06/17/2022- Interim hx  Patient presents today to review sleep study results.  Patient was seen for original sleep consult in April due to snoring symptoms and restless sleep. No significant daytime sleepiness.  She had home sleep study on 05/19/2022 that showed evidence of moderate obstructive sleep apnea, AHI 22.2 an hour with SpO2 low 72%. Reviewed treatment options including weight loss, oral appliance, CPAP therapy referral to ENT for possible surgical options.  She is interested in pursuing oral device for treatment of her sleep apnea.  If patient not a candidate she would be open to starting CPAP but she is hesitant.  Patient primarily sleeps on her side.    Allergies  Allergen Reactions   Latex Other (See Comments)    Unknown per patient    Metronidazole Other (See Comments)    Unknown per patient    Immunization History  Administered Date(s) Administered   PFIZER(Purple Top)SARS-COV-2 Vaccination 09/04/2019, 09/25/2019    Past Medical History:  Diagnosis Date   Vertigo     Tobacco History: Social History   Tobacco Use  Smoking Status Never  Smokeless Tobacco Never   Counseling given: Not Answered   Outpatient Medications Prior to Visit  Medication Sig Dispense Refill   amLODipine (NORVASC) 10 MG tablet Take 10 mg by mouth daily.     meclizine (ANTIVERT) 12.5 MG tablet Take 12.5 mg by mouth 2 (two) times daily as needed for dizziness.     benzonatate (TESSALON) 100 MG capsule Take 1 capsule (100 mg total) by mouth 3 (three) times daily as needed for cough. (Patient not taking: Reported on 05/03/2022) 21 capsule 0   cyclobenzaprine (FLEXERIL) 10 MG tablet Take 1 tablet (10 mg total) by mouth 2 (two) times daily as needed for muscle spasms. (Patient not taking: Reported on 06/17/2022) 20 tablet 0   fluticasone (FLONASE) 50 MCG/ACT nasal spray Place 1 spray into both nostrils daily. (Patient not taking: Reported on 05/03/2022)     ibuprofen (ADVIL) 600 MG tablet Take 1 tablet (600 mg total) by mouth every 6 (six) hours as needed. (Patient not taking: Reported on 06/17/2022) 30 tablet 0   methocarbamol (ROBAXIN) 500 MG tablet Take 1 tablet (500 mg total) by mouth 2 (two) times daily. (Patient not taking: Reported on 05/03/2022) 20 tablet 0   oxyCODONE (ROXICODONE) 5 MG immediate  release tablet Take 1 tablet (5 mg total) by mouth every 4 (four) hours as needed for severe pain. (Patient not taking: Reported on 05/03/2022) 6 tablet 0   No facility-administered medications prior to visit.    Review of Systems  Review of Systems  Constitutional: Negative.   HENT: Negative.    Respiratory: Negative.    Psychiatric/Behavioral:  Positive for sleep disturbance.    Physical Exam  BP 110/70 (BP Location: Left Arm, Patient  Position: Sitting, Cuff Size: Normal)   Pulse 74   Temp (!) 97.4 F (36.3 C) (Temporal)   Ht 4\' 11"  (1.499 m)   Wt 196 lb 6.4 oz (89.1 kg)   SpO2 96%   BMI 39.67 kg/m  Physical Exam Constitutional:      Appearance: Normal appearance.  HENT:     Head: Normocephalic and atraumatic.     Mouth/Throat:     Mouth: Mucous membranes are moist.     Pharynx: Oropharynx is clear.  Cardiovascular:     Rate and Rhythm: Normal rate and regular rhythm.  Pulmonary:     Effort: Pulmonary effort is normal.     Breath sounds: Normal breath sounds.  Musculoskeletal:        General: Normal range of motion.  Skin:    General: Skin is warm and dry.  Neurological:     General: No focal deficit present.     Mental Status: She is alert and oriented to person, place, and time. Mental status is at baseline.  Psychiatric:        Mood and Affect: Mood normal.        Behavior: Behavior normal.        Thought Content: Thought content normal.        Judgment: Judgment normal.      Lab Results:  CBC    Component Value Date/Time   WBC 5.1 04/09/2022 2211   RBC 4.16 04/09/2022 2211   HGB 13.7 04/09/2022 2211   HCT 39.3 04/09/2022 2211   PLT 252 04/09/2022 2211   MCV 94.5 04/09/2022 2211   MCH 32.9 04/09/2022 2211   MCHC 34.9 04/09/2022 2211   RDW 11.7 04/09/2022 2211   LYMPHSABS 1.7 04/05/2022 1414   MONOABS 0.4 04/05/2022 1414   EOSABS 0.0 04/05/2022 1414   BASOSABS 0.0 04/05/2022 1414    BMET    Component Value Date/Time   NA 138 04/09/2022 2211   K 3.8 04/09/2022 2211   CL 104 04/09/2022 2211   CO2 24 04/09/2022 2211   GLUCOSE 134 (H) 04/09/2022 2211   BUN 14 04/09/2022 2211   CREATININE 1.00 04/09/2022 2211   CALCIUM 8.9 04/09/2022 2211   GFRNONAA >60 04/09/2022 2211   GFRAA >60 05/20/2019 2055    BNP No results found for: "BNP"  ProBNP No results found for: "PROBNP"  Imaging: No results found.   Assessment & Plan:   Sleep apnea - Patient has symptoms of snoring  and restless sleep.  Home sleep study 05/19/22 >> AHI 22.2/hour with SpO2 low 72% (average 93%). Reviewed treatment options, she is interested in pursuing oral device for treatments of OSA. We placed an order for referral to Dr. Myrtis Ser, DDS.  Advised patient to notify office if she is not candidate for oral appliance and we will either refer her to ENT for airway exam for possible surgical options or start patient on auto CPAP.  Encourage weight loss and side sleeping position. Cautioned against alcohol use prior to bedtime as this can  worsen sleep apnea.  Follow-up in 3 to 4 months or sooner if needed.   Glenford Bayley, NP 06/17/2022

## 2022-06-17 NOTE — Progress Notes (Signed)
Reviewed and agree with assessment/plan.   Coralyn Helling, MD [facility] Pulmonary/Critical Care 06/17/2022, 10:39 AM Pager:  [phone]

## 2022-06-17 NOTE — Patient Instructions (Addendum)
Sleep study showed evidence of moderate obstructive sleep apnea, treatment options include weight loss, oral appliance, CPAP therapy referral to ENT for possible surgical options  We will pursue oral appliance for the treatment of your OSA, if this does not work out please notify office   Referral: Dentistry for oral appliance / moderate sleep apnea   [facility] [address] [place], [place] [address] [phone]  Follow-up: 4 months with [facility] NP   Sleep Apnea Sleep apnea is a condition in which breathing pauses or becomes shallow during sleep. People with sleep apnea usually snore loudly. They may have times when they gasp and stop breathing for 10 seconds or more during sleep. This may happen many times during the night. Sleep apnea disrupts your sleep and keeps your body from getting the rest that it needs. This condition can increase your risk of certain health problems, including: Heart attack. Stroke. Obesity. Type 2 diabetes. Heart failure. Irregular heartbeat. High blood pressure. The goal of treatment is to help you breathe normally again. What are the causes?  The most common cause of sleep apnea is a collapsed or blocked airway. There are three kinds of sleep apnea: Obstructive sleep apnea. This kind is caused by a blocked or collapsed airway. Central sleep apnea. This kind happens when the part of the brain that controls breathing does not send the correct signals to the muscles that control breathing. Mixed sleep apnea. This is a combination of obstructive and central sleep apnea. What increases the risk? You are more likely to develop this condition if you: Are overweight. Smoke. Have a smaller than normal airway. Are older. Are female. Drink alcohol. Take sedatives or tranquilizers. Have a family history of sleep apnea. Have a tongue or tonsils that are larger than normal. What are the signs or  symptoms? Symptoms of this condition include: Trouble staying asleep. Loud snoring. Morning headaches. Waking up gasping. Dry mouth or sore throat in the morning. Daytime sleepiness and tiredness. If you have daytime fatigue because of sleep apnea, you may be more likely to have: Trouble concentrating. Forgetfulness. Irritability or mood swings. Personality changes. Feelings of depression. Sexual dysfunction. This may include loss of interest if you are female, or erectile dysfunction if you are female. How is this diagnosed? This condition may be diagnosed with: A medical history. A physical exam. A series of tests that are done while you are sleeping (sleep study). These tests are usually done in a sleep lab, but they may also be done at home. How is this treated? Treatment for this condition aims to restore normal breathing and to ease symptoms during sleep. It may involve managing health issues that can affect breathing, such as high blood pressure or obesity. Treatment may include: Sleeping on your side. Using a decongestant if you have nasal congestion. Avoiding the use of depressants, including alcohol, sedatives, and narcotics. Losing weight if you are overweight. Making changes to your diet. Quitting smoking. Using a device to open your airway while you sleep, such as: An oral appliance. This is a custom-made mouthpiece that shifts your lower jaw forward. A continuous positive airway pressure (CPAP) device. This device blows air through a mask when you breathe out (exhale). A nasal expiratory positive airway pressure (EPAP) device. This device has valves that you put into each nostril. A bi-level positive airway pressure (BIPAP) device. This device blows air through a mask when you breathe in (inhale) and breathe out (exhale). Having surgery if other  treatments do not work. During surgery, excess tissue is removed to create a wider airway. Follow these instructions at  home: Lifestyle Make any lifestyle changes that your health care provider recommends. Eat a healthy, well-balanced diet. Take steps to lose weight if you are overweight. Avoid using depressants, including alcohol, sedatives, and narcotics. Do not use any products that contain nicotine or tobacco. These products include cigarettes, chewing tobacco, and vaping devices, such as e-cigarettes. If you need help quitting, ask your health care provider. General instructions Take over-the-counter and prescription medicines only as told by your health care provider. If you were given a device to open your airway while you sleep, use it only as told by your health care provider. If you are having surgery, make sure to tell your health care provider you have sleep apnea. You may need to bring your device with you. Keep all follow-up visits. This is important. Contact a health care provider if: The device that you received to open your airway during sleep is uncomfortable or does not seem to be working. Your symptoms do not improve. Your symptoms get worse. Get help right away if: You develop: Chest pain. Shortness of breath. Discomfort in your back, arms, or stomach. You have: Trouble speaking. Weakness on one side of your body. Drooping in your face. These symptoms may represent a serious problem that is an emergency. Do not wait to see if the symptoms will go away. Get medical help right away. Call your local emergency services (911 in the [place] Do not drive yourself to the hospital. Summary Sleep apnea is a condition in which breathing pauses or becomes shallow during sleep. The most common cause is a collapsed or blocked airway. The goal of treatment is to restore normal breathing and to ease symptoms during sleep. This information is not intended to replace advice given to you by your health care provider. Make sure you discuss any questions you have with your health care provider. Document  Revised: 08/05/2020 Document Reviewed: 12/06/2019 Elsevier Patient Education  2024 [employer].

## 2022-09-26 IMAGING — DX DG FOOT COMPLETE 3+V*L*
2 series · 3 of 3 positions shown · non-contrast
Comparison: None.

CLINICAL DATA: Injured left great toe this morning.

EXAM:
LEFT FOOT - COMPLETE 3+ VIEW

[Series 1: foot · 0.14mm/px · 2 of 2 slices shown]
[im 1/2]
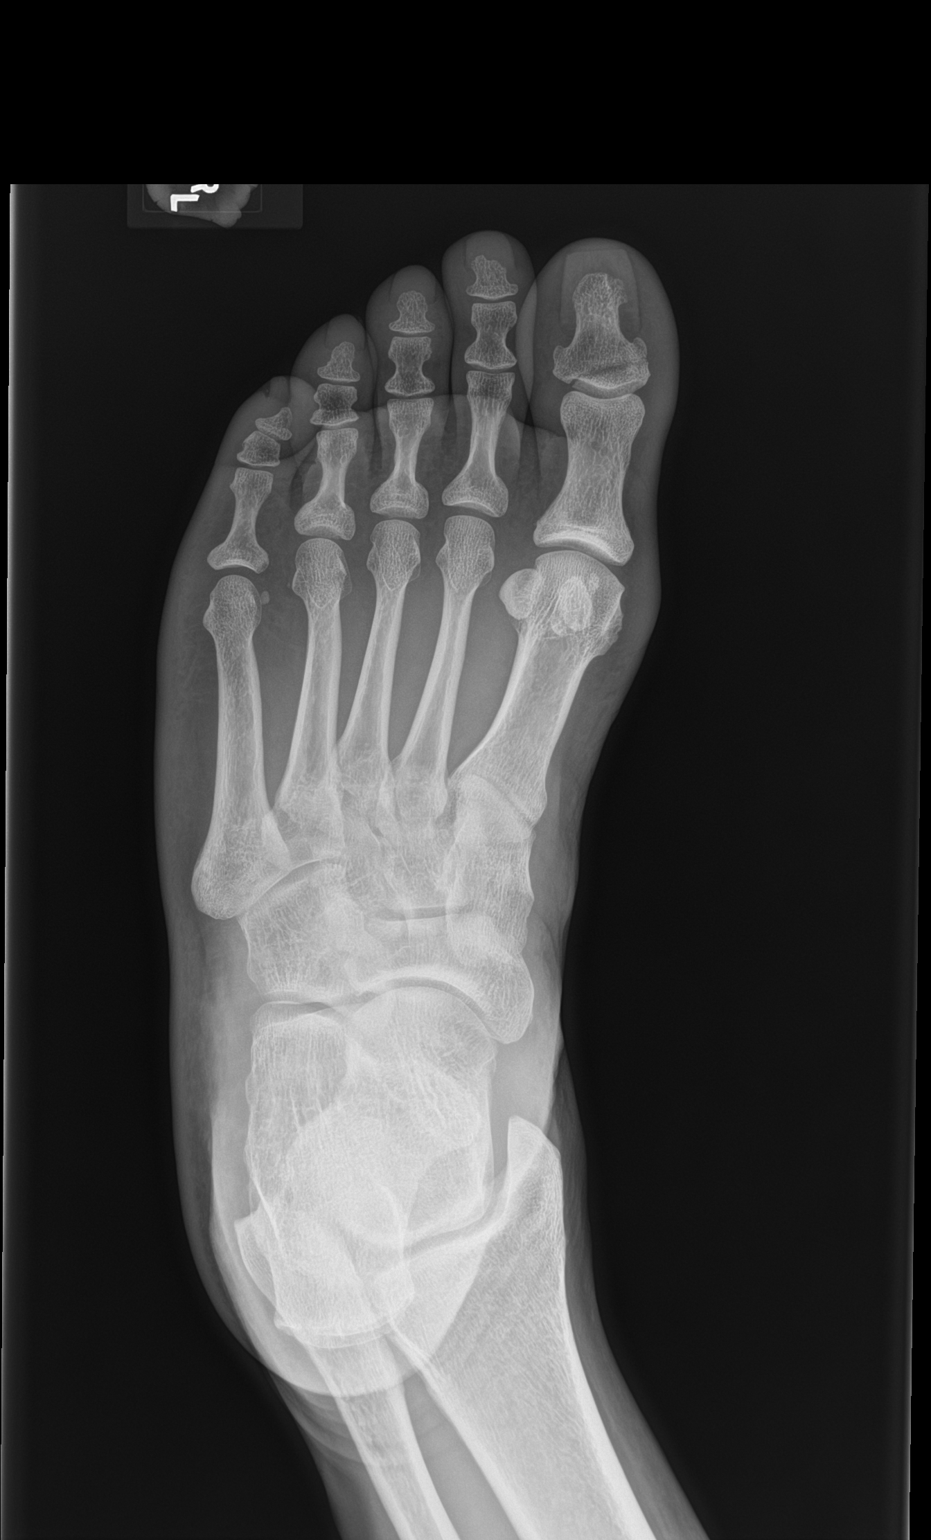
[im 2/2]
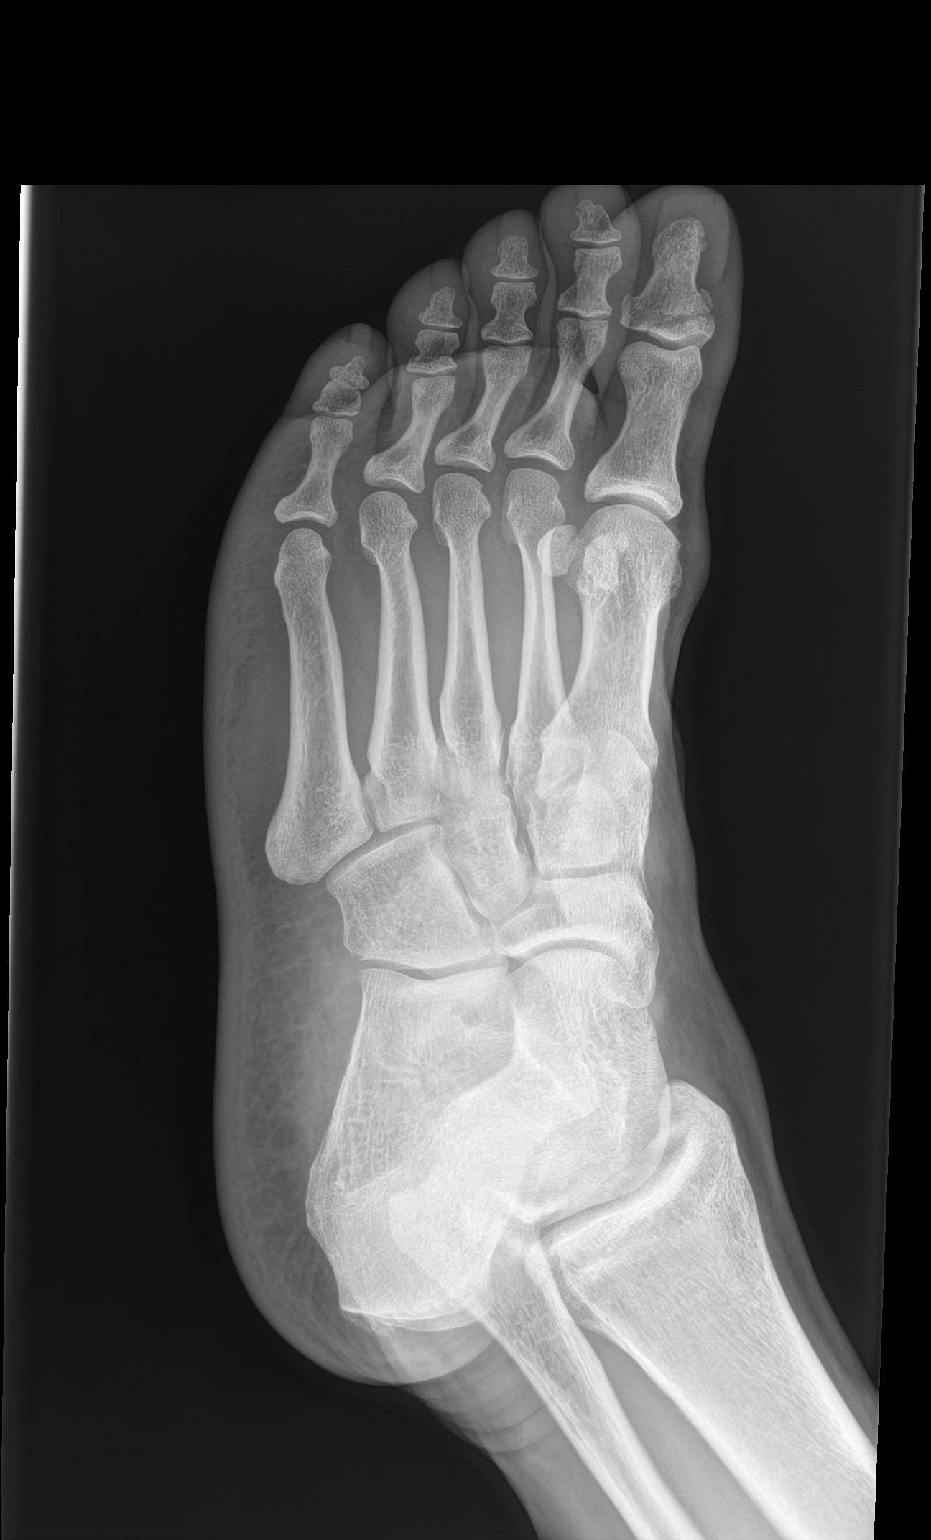

[leg]
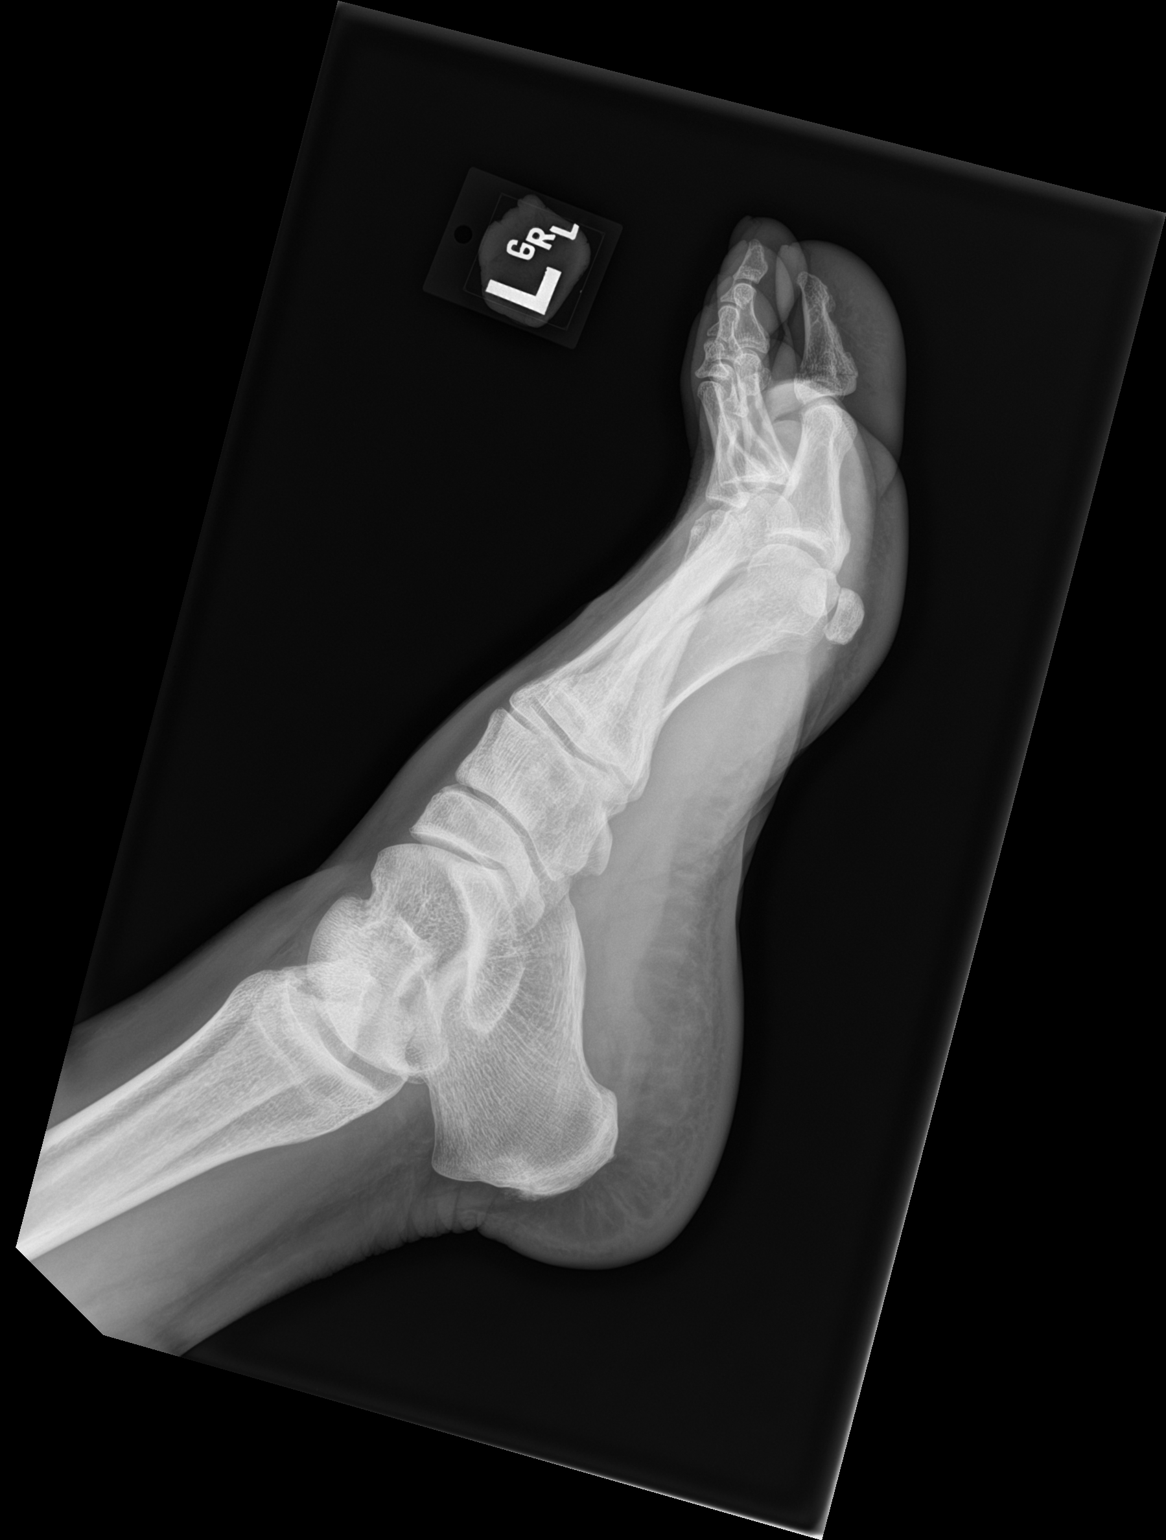

[3 of 3 positions shown; findings below may reference images not displayed]

FINDINGS: There is a nondisplaced intra-articular fracture involving the
distal phalanx of the great toe.

The other bony structures are intact. No other fractures are
identified.
IMPRESSION: Nondisplaced intra-articular fracture involving the distal phalanx
of the great toe.

## 2022-10-17 ENCOUNTER — Telehealth: Payer: Self-pay | Admitting: Primary Care

## 2022-10-17 ENCOUNTER — Ambulatory Visit: Payer: Medicaid Other | Admitting: Primary Care

## 2022-10-17 DIAGNOSIS — G4733 Obstructive sleep apnea (adult) (pediatric): Secondary | ICD-10-CM

## 2022-10-17 NOTE — Telephone Encounter (Signed)
PT was referred for an oral appliance but it was 3K and her ins did not cover it. She is canceling her FU appt and would like to move fwd with a CPAP therapy. Can she do so w/o a FU appt and then do a CPAP fu after she has been using the CPAP? [phone] please call to advise.

## 2022-10-18 ENCOUNTER — Ambulatory Visit: Payer: Medicaid Other | Admitting: Primary Care

## 2022-10-18 NOTE — Telephone Encounter (Signed)
Home sleep study 05/19/22 >> AHI 22.2/hour with SpO2 low 72% (average 93%).   I will place an order for CPAP, amy please schedule follow-up in 8-12 weeks for compliance check

## 2022-10-19 NOTE — Telephone Encounter (Signed)
I called and left msg. for pt to call to set up appt and that we ordered a CPAP

## 2022-10-21 NOTE — Telephone Encounter (Addendum)
Called patient.  Gave information.  Scheduled return visit in December 2024.

## 2022-12-26 ENCOUNTER — Telehealth: Payer: Self-pay

## 2022-12-27 ENCOUNTER — Ambulatory Visit: Payer: Medicaid Other | Admitting: Primary Care

## 2023-02-02 NOTE — Telephone Encounter (Signed)
nfn
